# Patient Record
Sex: Male | Born: 1955
Health system: Southern US, Community
[De-identification: ages and names within clinical notes are randomized; demographics above are authoritative.]

## PROBLEM LIST (undated history)

## (undated) DIAGNOSIS — F17200 Nicotine dependence, unspecified, uncomplicated: Secondary | ICD-10-CM

## (undated) DIAGNOSIS — E669 Obesity, unspecified: Secondary | ICD-10-CM

## (undated) DIAGNOSIS — I10 Essential (primary) hypertension: Secondary | ICD-10-CM

## (undated) DIAGNOSIS — E785 Hyperlipidemia, unspecified: Secondary | ICD-10-CM

## (undated) DIAGNOSIS — E1169 Type 2 diabetes mellitus with other specified complication: Secondary | ICD-10-CM

## (undated) HISTORY — PX: ELBOW SURGERY: SHX618

## (undated) HISTORY — PX: BACK SURGERY: SHX140

---

## 2019-10-01 ENCOUNTER — Encounter (HOSPITAL_COMMUNITY): Payer: Self-pay | Admitting: Emergency Medicine

## 2019-10-01 ENCOUNTER — Inpatient Hospital Stay (HOSPITAL_COMMUNITY)
Admission: EM | Admit: 2019-10-01 | Discharge: 2019-10-04 | DRG: 065 | Disposition: A | Discharge status: Home Health | Payer: Medicare Other | Attending: Internal Medicine | Admitting: Internal Medicine

## 2019-10-01 ENCOUNTER — Inpatient Hospital Stay (HOSPITAL_COMMUNITY): Payer: Medicare Other

## 2019-10-01 ENCOUNTER — Other Ambulatory Visit: Payer: Self-pay

## 2019-10-01 ENCOUNTER — Emergency Department (HOSPITAL_COMMUNITY): Payer: Medicare Other

## 2019-10-01 DIAGNOSIS — I639 Cerebral infarction, unspecified: Secondary | ICD-10-CM | POA: Diagnosis present

## 2019-10-01 DIAGNOSIS — E7849 Other hyperlipidemia: Secondary | ICD-10-CM | POA: Diagnosis not present

## 2019-10-01 DIAGNOSIS — I6529 Occlusion and stenosis of unspecified carotid artery: Secondary | ICD-10-CM

## 2019-10-01 DIAGNOSIS — I10 Essential (primary) hypertension: Secondary | ICD-10-CM

## 2019-10-01 DIAGNOSIS — I6521 Occlusion and stenosis of right carotid artery: Secondary | ICD-10-CM

## 2019-10-01 DIAGNOSIS — I351 Nonrheumatic aortic (valve) insufficiency: Secondary | ICD-10-CM | POA: Diagnosis not present

## 2019-10-01 DIAGNOSIS — Z9114 Patient's other noncompliance with medication regimen: Secondary | ICD-10-CM

## 2019-10-01 DIAGNOSIS — I63411 Cerebral infarction due to embolism of right middle cerebral artery: Secondary | ICD-10-CM | POA: Diagnosis present

## 2019-10-01 DIAGNOSIS — R471 Dysarthria and anarthria: Secondary | ICD-10-CM | POA: Diagnosis present

## 2019-10-01 DIAGNOSIS — Z9119 Patient's noncompliance with other medical treatment and regimen: Secondary | ICD-10-CM | POA: Diagnosis not present

## 2019-10-01 DIAGNOSIS — R29810 Facial weakness: Secondary | ICD-10-CM | POA: Diagnosis present

## 2019-10-01 DIAGNOSIS — R4781 Slurred speech: Secondary | ICD-10-CM | POA: Diagnosis present

## 2019-10-01 DIAGNOSIS — E1165 Type 2 diabetes mellitus with hyperglycemia: Secondary | ICD-10-CM | POA: Diagnosis present

## 2019-10-01 DIAGNOSIS — R1310 Dysphagia, unspecified: Secondary | ICD-10-CM | POA: Diagnosis present

## 2019-10-01 DIAGNOSIS — Z598 Other problems related to housing and economic circumstances: Secondary | ICD-10-CM | POA: Diagnosis not present

## 2019-10-01 DIAGNOSIS — F17210 Nicotine dependence, cigarettes, uncomplicated: Secondary | ICD-10-CM | POA: Diagnosis present

## 2019-10-01 DIAGNOSIS — R29705 NIHSS score 5: Secondary | ICD-10-CM | POA: Diagnosis present

## 2019-10-01 DIAGNOSIS — E785 Hyperlipidemia, unspecified: Secondary | ICD-10-CM | POA: Diagnosis present

## 2019-10-01 DIAGNOSIS — I361 Nonrheumatic tricuspid (valve) insufficiency: Secondary | ICD-10-CM | POA: Diagnosis not present

## 2019-10-01 DIAGNOSIS — G8194 Hemiplegia, unspecified affecting left nondominant side: Secondary | ICD-10-CM | POA: Diagnosis present

## 2019-10-01 DIAGNOSIS — Z20822 Contact with and (suspected) exposure to covid-19: Secondary | ICD-10-CM | POA: Diagnosis present

## 2019-10-01 DIAGNOSIS — I63231 Cerebral infarction due to unspecified occlusion or stenosis of right carotid arteries: Secondary | ICD-10-CM | POA: Diagnosis not present

## 2019-10-01 DIAGNOSIS — Z91199 Patient's noncompliance with other medical treatment and regimen due to unspecified reason: Secondary | ICD-10-CM

## 2019-10-01 DIAGNOSIS — E119 Type 2 diabetes mellitus without complications: Secondary | ICD-10-CM | POA: Diagnosis not present

## 2019-10-01 HISTORY — DX: Essential (primary) hypertension: I10

## 2019-10-01 HISTORY — DX: Cerebral infarction, unspecified: I63.9

## 2019-10-01 LAB — COMPREHENSIVE METABOLIC PANEL
ALT: 13 U/L (ref 0–44)
AST: 14 U/L — ABNORMAL LOW (ref 15–41)
Albumin: 4.3 g/dL (ref 3.5–5.0)
Alkaline Phosphatase: 66 U/L (ref 38–126)
Anion gap: 9 (ref 5–15)
BUN: 12 mg/dL (ref 8–23)
CO2: 23 mmol/L (ref 22–32)
Calcium: 9.1 mg/dL (ref 8.9–10.3)
Chloride: 103 mmol/L (ref 98–111)
Creatinine, Ser: 0.76 mg/dL (ref 0.61–1.24)
GFR calc Af Amer: >60 mL/min (ref >60)
GFR calc non Af Amer: >60 mL/min (ref >60)
Glucose, Bld: 245 mg/dL — ABNORMAL HIGH (ref 70–99)
Potassium: 4 mmol/L (ref 3.5–5.1)
Sodium: 135 mmol/L (ref 135–145)
Total Bilirubin: 1.1 mg/dL (ref 0.3–1.2)
Total Protein: 7.5 g/dL (ref 6.5–8.1)

## 2019-10-01 LAB — DIFFERENTIAL
Abs Immature Granulocytes: 0.03 10*3/uL (ref 0.00–0.07)
Basophils Absolute: 0.1 10*3/uL (ref 0.0–0.1)
Basophils Relative: 1 %
Eosinophils Absolute: 0.2 10*3/uL (ref 0.0–0.5)
Eosinophils Relative: 1 %
Immature Granulocytes: 0 %
Lymphocytes Relative: 24 %
Lymphs Abs: 2.7 10*3/uL (ref 0.7–4.0)
Monocytes Absolute: 0.7 10*3/uL (ref 0.1–1.0)
Monocytes Relative: 6 %
Neutro Abs: 7.8 10*3/uL — ABNORMAL HIGH (ref 1.7–7.7)
Neutrophils Relative %: 68 %

## 2019-10-01 LAB — CBC
HCT: 48.6 % (ref 39.0–52.0)
Hemoglobin: 16.6 g/dL (ref 13.0–17.0)
MCH: 31.8 pg (ref 26.0–34.0)
MCHC: 34.2 g/dL (ref 30.0–36.0)
MCV: 93.1 fL (ref 80.0–100.0)
Platelets: 269 10*3/uL (ref 150–400)
RBC: 5.22 MIL/uL (ref 4.22–5.81)
RDW: 12.2 % (ref 11.5–15.5)
WBC: 11.4 10*3/uL — ABNORMAL HIGH (ref 4.0–10.5)
nRBC: 0 % (ref 0.0–0.2)

## 2019-10-01 LAB — RESPIRATORY PANEL BY RT PCR (FLU A&B, COVID)
Influenza A by PCR: NEGATIVE
Influenza B by PCR: NEGATIVE
SARS Coronavirus 2 by RT PCR: NEGATIVE

## 2019-10-01 LAB — PROTIME-INR
INR: 1 (ref 0.8–1.2)
Prothrombin Time: 12.9 seconds (ref 11.4–15.2)

## 2019-10-01 LAB — GLUCOSE, CAPILLARY: Glucose-Capillary: 117 mg/dL — ABNORMAL HIGH (ref 70–99)

## 2019-10-01 LAB — CBG MONITORING, ED: Glucose-Capillary: 114 mg/dL — ABNORMAL HIGH (ref 70–99)

## 2019-10-01 LAB — HEMOGLOBIN A1C
Hgb A1c MFr Bld: 7.7 % — ABNORMAL HIGH (ref 4.8–5.6)
Mean Plasma Glucose: 174.29 mg/dL

## 2019-10-01 LAB — I-STAT BETA HCG BLOOD, ED (MC, WL, AP ONLY): I-stat hCG, quantitative: <5 m[IU]/mL (ref <5)

## 2019-10-01 LAB — HIV ANTIBODY (ROUTINE TESTING W REFLEX): HIV Screen 4th Generation wRfx: NONREACTIVE

## 2019-10-01 LAB — APTT: aPTT: 29 seconds (ref 24–36)

## 2019-10-01 MED ORDER — ENOXAPARIN SODIUM 40 MG/0.4ML ~~LOC~~ SOLN
40.0000 mg | SUBCUTANEOUS | Status: DC
  Administered 2019-10-01: 40 mg via SUBCUTANEOUS
  Filled 2019-10-01: qty 0.4

## 2019-10-01 MED ORDER — STROKE: EARLY STAGES OF RECOVERY BOOK
Freq: Once | Status: AC
  Filled 2019-10-01: qty 1

## 2019-10-01 MED ORDER — IOHEXOL 350 MG/ML SOLN
75.0000 mL | Freq: Once | INTRAVENOUS | Status: AC | PRN
  Administered 2019-10-01: 75 mL via INTRAVENOUS

## 2019-10-01 MED ORDER — LORAZEPAM 2 MG/ML IJ SOLN
INTRAMUSCULAR | Status: AC
  Administered 2019-10-01: 17:00:00 0.5 mg via INTRAVENOUS
  Filled 2019-10-01: qty 1

## 2019-10-01 MED ORDER — ACETAMINOPHEN 325 MG PO TABS
650.0000 mg | ORAL_TABLET | ORAL | Status: DC | PRN
  Administered 2019-10-03 – 2019-10-04 (×2): 650 mg via ORAL
  Filled 2019-10-01 (×2): qty 2

## 2019-10-01 MED ORDER — LORAZEPAM 2 MG/ML IJ SOLN: 0.5000 mg | Freq: Once | INTRAMUSCULAR | Status: AC

## 2019-10-01 MED ORDER — HYDRALAZINE HCL 25 MG PO TABS: 25.0000 mg | ORAL_TABLET | Freq: Four times a day (QID) | ORAL | Status: DC | PRN

## 2019-10-01 MED ORDER — INSULIN ASPART 100 UNIT/ML ~~LOC~~ SOLN
0.0000 [IU] | Freq: Three times a day (TID) | SUBCUTANEOUS | Status: DC
  Administered 2019-10-02: 1 [IU] via SUBCUTANEOUS
  Administered 2019-10-02: 2 [IU] via SUBCUTANEOUS
  Administered 2019-10-02 – 2019-10-04 (×4): 1 [IU] via SUBCUTANEOUS

## 2019-10-01 MED ORDER — ASPIRIN EC 81 MG PO TBEC: 81.0000 mg | DELAYED_RELEASE_TABLET | Freq: Every day | ORAL | Status: DC

## 2019-10-01 MED ORDER — ASPIRIN 300 MG RE SUPP
300.0000 mg | Freq: Every day | RECTAL | Status: DC
  Administered 2019-10-01: 300 mg via RECTAL
  Filled 2019-10-01 (×2): qty 1

## 2019-10-01 MED ORDER — ATORVASTATIN CALCIUM 40 MG PO TABS
40.0000 mg | ORAL_TABLET | Freq: Every day | ORAL | Status: DC
  Administered 2019-10-02 – 2019-10-04 (×2): 40 mg via ORAL
  Filled 2019-10-01 (×2): qty 1

## 2019-10-01 MED ORDER — ACETAMINOPHEN 160 MG/5ML PO SOLN: 650.0000 mg | ORAL | Status: DC | PRN

## 2019-10-01 MED ORDER — ACETAMINOPHEN 650 MG RE SUPP: 650.0000 mg | RECTAL | Status: DC | PRN

## 2019-10-01 MED ORDER — SENNOSIDES-DOCUSATE SODIUM 8.6-50 MG PO TABS: 1.0000 | ORAL_TABLET | Freq: Every evening | ORAL | Status: DC | PRN

## 2019-10-01 MED ORDER — SODIUM CHLORIDE 0.9 % IV SOLN: INTRAVENOUS | Status: DC

## 2019-10-01 NOTE — ED Triage Notes (Signed)
Pt reports feeling weak all over and not being able to eat/drink due to it falling out of his mouth. Endorses backing into a car today but denies any pain. Symptoms have been going on for a week. States he just wants to sleep all the time. Not taking his BP meds.

## 2019-10-01 NOTE — Consult Note (Addendum)
Referring Physician: Dr. Anitra Lauth    Reason for Consult: can't drink or eat, drooling  HPI: David Hendrix is an 64 y.o. male with HTN and DM2, but non-compliant with medications d/t cost. Since Saturday, 4/24 the pt says he has not been feeling well and unable to eat/drink without chocking and noted drooling. Finally today when he went to the laundry mat, someone there noted a left facial droop as sign of stroke and he was brought to the ER for evaluation. Pt says it did not occur to him to look in the mirror since Sat.    Date last known well: sometime during the day on 09/28/19 tPA Given: no, way to far out of time window  Past Medical History Past Medical History:  Diagnosis Date  . Hypertension   DM2  Surgical History Lives alone, works as a Chartered certified accountant. Family History  Dad: died of ETOH liver disease Mom: Alzhimers, but otherwise alive and well Siblings: all alive and well w/o any medical issues reported  Social History:  No drugs or ETOH. Smokes cigaretts, 1PPD  Allergies:  No Known Allergies  Home Medications:  none  Hospital Medications   ROS:  History obtained from pt  General ROS: negative for - chills, fatigue, fever, night sweats, weight gain or weight loss Psychological ROS: negative for - behavioral disorder, hallucinations, memory difficulties, mood swings or suicidal ideation Ophthalmic ROS: negative for - blurry vision, double vision, eye pain or loss of vision ENT ROS: negative for - epistaxis, nasal discharge, oral lesions, sore throat, tinnitus or vertigo Allergy and Immunology ROS: negative for - hives or itchy/watery eyes Hematological and Lymphatic ROS: negative for - bleeding problems, bruising or swollen lymph nodes Endocrine ROS: negative for - galactorrhea, hair pattern changes, polydipsia/polyuria or temperature intolerance Respiratory ROS: negative for - cough, hemoptysis, shortness of breath or wheezing Cardiovascular ROS: negative for -  chest pain, dyspnea on exertion, edema or irregular heartbeat Gastrointestinal ROS: negative for - abdominal pain, diarrhea, hematemesis, nausea/vomiting or stool incontinence Genito-Urinary ROS: negative for - dysuria, hematuria, incontinence or urinary frequency/urgency Musculoskeletal ROS: negative for - joint swelling or muscular weakness Neurological ROS: as noted in HPI Dermatological ROS: negative for rash and skin lesion changes   Physical Examination:  Vitals:   10/01/19 1217 10/01/19 1430  BP: (!) 176/90   Pulse: 77 69  Resp: 18 13  Temp: 97.7 F (36.5 C)   TempSrc: Axillary   SpO2: 95% 98%  Weight: 95.3 kg   Height: 5\' 10"  (1.778 m)   HENT: Positive for drooling.   Respiratory: Negative for cough and shortness of breath.   Cardiovascular: Negative for chest pain.  Gastrointestinal: Negative for abdominal pain, anorexia and nausea.  Genitourinary: Negative for dysuria.  Musculoskeletal: Negative for falls.  Neurological: A&Ox4, speech is dysarthric. No anomia. No aphashia, PERRL, EOMI, dense left facial droop. Tongue is not midline.shrug is weak on left. 4/5 grip on left w/drift. 4/5 foot push w/ drift on lleft lrg. Rt wnl. FTN, HTS wnl Did not test gait  NIHSS 1a Level of Conscious.: 0 1b LOC Questions: 0 1c LOC Commands:0  2 Best Gaze: 0 3 Visual: 0 4 Facial Palsy: 2  5a Motor Arm - Left:1 5b Motor Arm - Right0:  6a Motor Leg - Left: 1 6b Motor Leg - Right:0  7 Limb Ataxia: 0 8 Sensory: 1 9 Best Language: 0 10 Dysarthria: 1 11 Extinct. and Inatten.: 1 TOTAL: 7  LABORATORY STUDIES:  Basic Metabolic Panel: Recent Labs  Lab 10/01/19 1227  NA 135  K 4.0  CL 103  CO2 23  GLUCOSE 245*  BUN 12  CREATININE 0.76  CALCIUM 9.1    Liver Function Tests: Recent Labs  Lab 10/01/19 1227  AST 14*  ALT 13  ALKPHOS 66  BILITOT 1.1  PROT 7.5  ALBUMIN 4.3   No results for input(s): LIPASE, AMYLASE in the last 168 hours. No results for input(s):  AMMONIA in the last 168 hours.  CBC: Recent Labs  Lab 10/01/19 1227  WBC 11.4*  NEUTROABS 7.8*  HGB 16.6  HCT 48.6  MCV 93.1  PLT 269    Cardiac Enzymes: No results for input(s): CKTOTAL, CKMB, CKMBINDEX, TROPONINI in the last 168 hours.  BNP: Invalid input(s): POCBNP  CBG: No results for input(s): GLUCAP in the last 168 hours.  Microbiology:   Coagulation Studies: Recent Labs    10/01/19 1227  LABPROT 12.9  INR 1.0    Urinalysis: No results for input(s): COLORURINE, LABSPEC, PHURINE, GLUCOSEU, HGBUR, BILIRUBINUR, KETONESUR, PROTEINUR, UROBILINOGEN, NITRITE, LEUKOCYTESUR in the last 168 hours.  Invalid input(s): APPERANCEUR  Lipid Panel:  No results found for: CHOL, TRIG, HDL, CHOLHDL, VLDL, LDLCALC  HgbA1C:  No results found for: HGBA1C  Urine Drug Screen:  No results found for: LABOPIA, COCAINSCRNUR, LABBENZ, AMPHETMU, THCU, LABBARB   Alcohol Level:  No results for input(s): ETH in the last 168 hours.  Miscellaneous labs:  EKG  EKG   IMAGING: CT HEAD WO CONTRAST  Result Date: 10/01/2019 CLINICAL DATA:  Weakness, possible stroke. EXAM: CT HEAD WITHOUT CONTRAST TECHNIQUE: Contiguous axial images were obtained from the base of the skull through the vertex without intravenous contrast. COMPARISON:  None. FINDINGS: Brain: Large right frontal low density is noted most consistent with acute infarction. No midline shift is noted. Ventricular size is within normal limits. No definite hemorrhage is noted. Vascular: No hyperdense vessel or unexpected calcification. Skull: Normal. Negative for fracture or focal lesion. Sinuses/Orbits: No acute finding. Other: None. IMPRESSION: Large right frontal low density is noted most consistent with acute infarction. No definite hemorrhage is noted. MRI is recommended for further evaluation. Electronically Signed   By: Marijo Conception M.D.   On: 10/01/2019 13:47   Assessment:  David Hendrix is a 64 y.o. male with history  of untreated HTN and DM2,  presenting with left side weakness, slurred speech, drooling and unable to eat/drink x 3 days. He did not receive IV t-PA due to out side of time window.  1. Subacute stroke- large right frontal hypodensity seen on CT, appears cardieoembolic etiology. MRI ordered for better etiology.  2. Lt side weakness, slurred speech, dysphagia- d/t above. Rehab evals pending.  Keep NPO till SLP can eval 3. Uncontrolled DM2 with hyperglycemia- gluc is 245, A1c pending 4. HTN, untreated- normotensive goals since this is not acute stroke 5. Tobacco smoker- counseled on quitting   Plan:  HgbA1c, fasting lipid panel  MRI brain without contrast  CT Angiogram Head and Neck   PT consult, OT consult, Speech consult  Echocardiogram  Prophylactic therapy - Antiplatelet medication, PR ASA since NPO  Normotensive BP goals  Statin Therapy - LDL goal < 70  Risk factor modification  Telemetry monitoring  Frequent neuro checks  Fall Precautions  DVT prophelaxis  NPO until swallowing evaluation has been passed    Attending Neurologist's note to follow  Desiree Metzger-Cihelka, ARNP-C, ANVP-BC Pager: [phone]   NEUROHOSPITALIST ADDENDUM Performed a face to face diagnostic evaluation.  I have reviewed the contents of history and physical exam as documented by PA/ARNP/Resident and agree with above documentation.  I have discussed and formulated the above plan as documented. Edits to the note have been made as needed.  64 year old male with past medical history of hypertension, diabetes presents with 3-day history of not being able to eat or drink and drooling.  MRI brain shows large right frontoparietal infarction secondary to M2 M3 occlusion.  CT angiogram confirms an ICA occlusion-likely acute.  Outside the window for thrombectomy/TPA.  Recommend close monitoring and if he worsens,  urgent consultation with Vascular or Neuro-IR.      Georgiana Spinner Ginger Leeth MD [facility] [phone]   If 7pm to 7am, please call on call as listed on AMION.

## 2019-10-01 NOTE — ED Provider Notes (Signed)
[facility] EMERGENCY DEPARTMENT Provider Note   CSN: [id number] Arrival date & time: 10/01/19  1206     History Chief Complaint  Patient presents with  . Weakness  . Facial Droop    David Hendrix is a 64 y.o. male.  The history is provided by the patient and a relative.  Weakness Severity:  Moderate Onset quality:  Gradual Duration:  3 days Timing:  Constant Progression:  Worsening Chronicity:  New Relieved by:  Nothing Worsened by:  Nothing Ineffective treatments:  None tried Associated symptoms: drooling, headaches and stroke symptoms   Associated symptoms: no abdominal pain, no anorexia, no chest pain, no cough, no difficulty walking, no dizziness, no dysuria, no falls, no loss of consciousness, no nausea, no sensory-motor deficit, no shortness of breath and no vision change   Risk factors comment:  Htn noncomplaint with meds and tobacco use      Past Medical History:  Diagnosis Date  . Hypertension     There are no problems to display for this patient.        No family history on file.  Social History   Tobacco Use  . Smoking status: Not on file  Substance Use Topics  . Alcohol use: Not on file  . Drug use: Not on file    Home Medications Prior to Admission medications   Not on File    Allergies    Patient has no known allergies.  Review of Systems   Review of Systems  HENT: Positive for drooling.   Respiratory: Negative for cough and shortness of breath.   Cardiovascular: Negative for chest pain.  Gastrointestinal: Negative for abdominal pain, anorexia and nausea.  Genitourinary: Negative for dysuria.  Musculoskeletal: Negative for falls.  Neurological: Positive for weakness and headaches. Negative for dizziness and loss of consciousness.  All other systems reviewed and are negative.   Physical Exam Updated Vital Signs BP (!) 176/90   Pulse 69   Temp 97.7 F (36.5 C) (Axillary)   Resp 13   Ht 5\' 10"  (1.778  m)   Wt 95.3 kg   SpO2 98%   BMI 30.13 kg/m   Physical Exam Vitals and nursing note reviewed.  Constitutional:      General: He is not in acute distress.    Appearance: Normal appearance. He is well-developed and normal weight.  HENT:     Head: Normocephalic and atraumatic.     Mouth/Throat:     Mouth: Mucous membranes are moist.  Eyes:     General: No visual field deficit.    Conjunctiva/sclera: Conjunctivae normal.     Pupils: Pupils are equal, round, and reactive to light.  Cardiovascular:     Rate and Rhythm: Normal rate and regular rhythm.     Pulses: Normal pulses.     Heart sounds: No murmur.  Pulmonary:     Effort: Pulmonary effort is normal. No respiratory distress.     Breath sounds: Normal breath sounds. No wheezing or rales.  Abdominal:     General: There is no distension.     Palpations: Abdomen is soft.     Tenderness: There is no abdominal tenderness. There is no guarding or rebound.  Musculoskeletal:        General: No tenderness. Normal range of motion.     Cervical back: Normal range of motion and neck supple.     Right lower leg: No edema.     Left lower leg: No edema.  Skin:  General: Skin is warm and dry.     Findings: No erythema or rash.  Neurological:     Mental Status: He is alert and oriented to person, place, and time.     Cranial Nerves: Facial asymmetry present. No dysarthria.     Sensory: Sensory deficit present.     Motor: No pronator drift.     Coordination: Heel to Shin Test normal.     Gait: Gait is intact.     Comments: Decreased sensation in left face.  Sparing of the left forehead.  5/5 strength in the bilateral upper and lower ext.  No pronator drift.    Psychiatric:        Behavior: Behavior normal.     Comments: Tearful on exam      ED Results / Procedures / Treatments   Labs (all labs ordered are listed, but only abnormal results are displayed) Labs Reviewed  CBC - Abnormal; Notable for the following components:       Result Value   WBC 11.4 (*)    All other components within normal limits  DIFFERENTIAL - Abnormal; Notable for the following components:   Neutro Abs 7.8 (*)    All other components within normal limits  COMPREHENSIVE METABOLIC PANEL - Abnormal; Notable for the following components:   Glucose, Bld 245 (*)    AST 14 (*)    All other components within normal limits  RESPIRATORY PANEL BY RT PCR (FLU A&B, COVID)  PROTIME-INR  APTT  I-STAT CHEM 8, ED  CBG MONITORING, ED  I-STAT BETA HCG BLOOD, ED ([facility], [facility], [facility] ONLY)    EKG EKG Interpretation  Date/Time:  Tuesday October 01 2019 12:16:32 EDT Ventricular Rate:  83 PR Interval:  152 QRS Duration: 82 QT Interval:  398 QTC Calculation: 467 R Axis:   40 Text Interpretation: Normal sinus rhythm Anterior infarct , age undetermined No previous tracing Confirmed by Gwyneth Sprout ([id number]) on 10/01/2019 2:21:15 PM   Radiology CT HEAD WO CONTRAST  Result Date: 10/01/2019 CLINICAL DATA:  Weakness, possible stroke. EXAM: CT HEAD WITHOUT CONTRAST TECHNIQUE: Contiguous axial images were obtained from the base of the skull through the vertex without intravenous contrast. COMPARISON:  None. FINDINGS: Brain: Large right frontal low density is noted most consistent with acute infarction. No midline shift is noted. Ventricular size is within normal limits. No definite hemorrhage is noted. Vascular: No hyperdense vessel or unexpected calcification. Skull: Normal. Negative for fracture or focal lesion. Sinuses/Orbits: No acute finding. Other: None. IMPRESSION: Large right frontal low density is noted most consistent with acute infarction. No definite hemorrhage is noted. MRI is recommended for further evaluation. Electronically Signed   By: Lupita Raider M.D.   On: 10/01/2019 13:47    Procedures Procedures (including critical care time)  Medications Ordered in ED Medications - No data to display  ED Course  I have reviewed the triage vital signs and  the nursing notes.  Pertinent labs & imaging results that were available during my care of the patient were reviewed by me and considered in my medical decision making (see chart for details).    MDM Rules/Calculators/A&P                      Patient presenting today with left-sided facial droop that has been present for at least a day now, unusual emotional behavior and some incoordination of his left arm.  Patient also reports he just feels very tired he wants to  sleep all the time and his been difficult to eat and drink because stuff is falling out of the left side of his mouth.  History of hypertension but does not take his medication regularly.  Also history of tobacco abuse.  On exam patient has left-sided facial droop but spares the forehead.  Concern for stroke and CT does show large area in the right frontal lobe concerning for possible stroke.  Patient does complain of also having some headaches.  He is hypertensive here but labs are otherwise within normal limits.  EKG without findings concerning for A. fib.  Spoke with Dr. Leia Alf with neurology recommends MR and MRA of brain.  Will admit for further stroke work-up.  MDM Number of Diagnoses or Management Options   Amount and/or Complexity of Data Reviewed Clinical lab tests: ordered and reviewed Tests in the radiology section of CPT: ordered and reviewed Tests in the medicine section of CPT: ordered and reviewed Decide to obtain previous medical records or to obtain history from someone other than the patient: yes Obtain history from someone other than the patient: yes Review and summarize past medical records: yes Discuss the patient with other providers: yes Independent visualization of images, tracings, or specimens: yes  Risk of Complications, Morbidity, and/or Mortality Presenting problems: moderate Diagnostic procedures: low Management options: low  Patient Progress Patient progress: stable  Final Clinical  Impression(s) / ED Diagnoses Final diagnoses:  Cerebrovascular accident (CVA), unspecified mechanism ([place])    Rx / DC Orders ED Discharge Orders    None       Blanchie Dessert, MD 10/01/19 1547

## 2019-10-01 NOTE — H&P (Signed)
History and Physical    [name] CWC:[id number] DOB: 03-Apr-1956 DOA: 10/01/2019  PCP: Patient, No Pcp Per   Patient coming from: Home  I have personally briefly reviewed patient's old medical records in [facility]  Chief Complaint: Drooling and trouble swallowing  HPI: David Hendrix is a 64 y.o. male with medical history significant of HTN and IIDM, but non-compliant with medications due to insurance issue.  Patient started to feel sick 3 days ago, with some trouble swallowing food and water think the food and water just slipped off the corner of his lips, but no significant coughing or choking no significant weakness at that point.  Today, while in the laundromat patient was told by a bystander that he has left facial droop as sign of stroke and he was brought to the ER for evaluation.  Patient denied any weakness of any of the limbs were numb or tingling of any of the limbs, no blurred vision or hearing changes no headache. ED Course: CT shows right frontal acute stroke  Review of Systems: As per HPI otherwise 10 point review of systems negative.    Past Medical History:  Diagnosis Date  . Hypertension        has no history on file for tobacco, alcohol, and drug.  No Known Allergies  No family history on file.   Prior to Admission medications   Not on File    Physical Exam: Vitals:   10/01/19 1217 10/01/19 1430 10/01/19 1603  BP: (!) 176/90  (!) 158/71  Pulse: 77 69 67  Resp: 18 13 17   Temp: 97.7 F (36.5 C)    TempSrc: Axillary    SpO2: 95% 98% 98%  Weight: 95.3 kg    Height: 5\' 10"  (1.778 m)      Constitutional: NAD, calm, comfortable Vitals:   10/01/19 1217 10/01/19 1430 10/01/19 1603  BP: (!) 176/90  (!) 158/71  Pulse: 77 69 67  Resp: 18 13 17   Temp: 97.7 F (36.5 C)    TempSrc: Axillary    SpO2: 95% 98% 98%  Weight: 95.3 kg    Height: 5\' 10"  (1.778 m)     Eyes: PERRL, lids and conjunctivae normal ENMT: Mucous membranes are moist.  Posterior pharynx clear of any exudate or lesions.Normal dentition.  Neck: normal, supple, no masses, no thyromegaly Respiratory: clear to auscultation bilaterally, no wheezing, no crackles. Normal respiratory effort. No accessory muscle use.  Cardiovascular: Regular rate and rhythm, no murmurs / rubs / gallops. No extremity edema. 2+ pedal pulses. No carotid bruits.  Abdomen: no tenderness, no masses palpated. No hepatosplenomegaly. Bowel sounds positive.  Musculoskeletal: no clubbing / cyanosis. No joint deformity upper and lower extremities. Good ROM, no contractures. Normal muscle tone.  Skin: no rashes, lesions, ulcers. No induration Neurologic: Left facial drooping, muscle strength 5/5 all major muscle groups, light touch sensation grossly intact, coordination intact.  Visual field intact Psychiatric: Normal judgment and insight. Alert and oriented x 3. Normal mood.     Labs on Admission: I have personally reviewed following labs and imaging studies  CBC: Recent Labs  Lab 10/01/19 1227  WBC 11.4*  NEUTROABS 7.8*  HGB 16.6  HCT 48.6  MCV 93.1  PLT 761   Basic Metabolic Panel: Recent Labs  Lab 10/01/19 1227  NA 135  K 4.0  CL 103  CO2 23  GLUCOSE 245*  BUN 12  CREATININE 0.76  CALCIUM 9.1   GFR: Estimated Creatinine Clearance: 106.6 mL/min (by C-G formula  based on SCr of 0.76 mg/dL). Liver Function Tests: Recent Labs  Lab 10/01/19 1227  AST 14*  ALT 13  ALKPHOS 66  BILITOT 1.1  PROT 7.5  ALBUMIN 4.3   No results for input(s): LIPASE, AMYLASE in the last 168 hours. No results for input(s): AMMONIA in the last 168 hours. Coagulation Profile: Recent Labs  Lab 10/01/19 1227  INR 1.0   Cardiac Enzymes: No results for input(s): CKTOTAL, CKMB, CKMBINDEX, TROPONINI in the last 168 hours. BNP (last 3 results) No results for input(s): PROBNP in the last 8760 hours. HbA1C: No results for input(s): HGBA1C in the last 72 hours. CBG: No results for input(s):  GLUCAP in the last 168 hours. Lipid Profile: No results for input(s): CHOL, HDL, LDLCALC, TRIG, CHOLHDL, LDLDIRECT in the last 72 hours. Thyroid Function Tests: No results for input(s): TSH, T4TOTAL, FREET4, T3FREE, THYROIDAB in the last 72 hours. Anemia Panel: No results for input(s): VITAMINB12, FOLATE, FERRITIN, TIBC, IRON, RETICCTPCT in the last 72 hours. Urine analysis: No results found for: COLORURINE, APPEARANCEUR, LABSPEC, PHURINE, GLUCOSEU, HGBUR, BILIRUBINUR, KETONESUR, PROTEINUR, UROBILINOGEN, NITRITE, LEUKOCYTESUR  Radiological Exams on Admission: DG Chest 2 View  Result Date: 10/01/2019 CLINICAL DATA:  Hypertension CVA EXAM: CHEST - 2 VIEW COMPARISON:  None. FINDINGS: No focal airspace disease or pleural effusion. Borderline cardiomegaly. Hazy/streaky atelectasis or scarring at the bases. No pneumothorax. IMPRESSION: Hazy/streaky atelectasis or scarring at the bases. Electronically Signed   By: Jasmine Pang M.D.   On: 10/01/2019 17:01   CT HEAD WO CONTRAST  Result Date: 10/01/2019 CLINICAL DATA:  Weakness, possible stroke. EXAM: CT HEAD WITHOUT CONTRAST TECHNIQUE: Contiguous axial images were obtained from the base of the skull through the vertex without intravenous contrast. COMPARISON:  None. FINDINGS: Brain: Large right frontal low density is noted most consistent with acute infarction. No midline shift is noted. Ventricular size is within normal limits. No definite hemorrhage is noted. Vascular: No hyperdense vessel or unexpected calcification. Skull: Normal. Negative for fracture or focal lesion. Sinuses/Orbits: No acute finding. Other: None. IMPRESSION: Large right frontal low density is noted most consistent with acute infarction. No definite hemorrhage is noted. MRI is recommended for further evaluation. Electronically Signed   By: Lupita Raider M.D.   On: 10/01/2019 13:47    EKG: Independently reviewed.  Normal sinus rhythm, no acute ST-T  changes  Assessment/Plan Active Problems:   CVA (cerebral vascular accident) (HCC)  Acute CVA, right frontal N.p.o. for now, speech evaluation CT angiogram Allow permissive hypertension for 2 to 3 days Start aspirin and statin Check A1c and lipid panel Echo, Tele monitoring  HTN As needed Hydralazine  IIDM Check A1C Sliding scale for now   DVT prophylaxis: Lovenox Code Status: Full code Family Communication: Daughter at bedside Disposition Plan: Follow-up speech evaluation, likely will need more than 2 midnight hospital stay Consults called: Neurology Admission status: Telemetry admission   Emeline General MD [facility] Pager [phone]    10/01/2019, 5:41 PM

## 2019-10-01 NOTE — Progress Notes (Signed)
D/W on call neurology Dr. Amada Jupiter, who reviewed CTA with neuro-IR Dr. Loreta Ave, given pt's symptoms started more than 3 days ago, unlikely will benefit from any emergency re-vascularization. Probably will consider angiogram some time during this admission, other management as planned earlier.

## 2019-10-02 ENCOUNTER — Inpatient Hospital Stay (HOSPITAL_COMMUNITY): Payer: Medicare Other

## 2019-10-02 DIAGNOSIS — E119 Type 2 diabetes mellitus without complications: Secondary | ICD-10-CM

## 2019-10-02 DIAGNOSIS — I6521 Occlusion and stenosis of right carotid artery: Secondary | ICD-10-CM

## 2019-10-02 DIAGNOSIS — I361 Nonrheumatic tricuspid (valve) insufficiency: Secondary | ICD-10-CM

## 2019-10-02 DIAGNOSIS — I639 Cerebral infarction, unspecified: Secondary | ICD-10-CM

## 2019-10-02 DIAGNOSIS — I351 Nonrheumatic aortic (valve) insufficiency: Secondary | ICD-10-CM

## 2019-10-02 DIAGNOSIS — E785 Hyperlipidemia, unspecified: Secondary | ICD-10-CM

## 2019-10-02 LAB — HEMOGLOBIN A1C
Hgb A1c MFr Bld: 7.6 % — ABNORMAL HIGH (ref 4.8–5.6)
Mean Plasma Glucose: 171.42 mg/dL

## 2019-10-02 LAB — ECHOCARDIOGRAM COMPLETE
Height: 70 in
Weight: 3360 oz

## 2019-10-02 LAB — LIPID PANEL
Cholesterol: 199 mg/dL (ref 0–200)
HDL: 22 mg/dL — ABNORMAL LOW (ref >40)
LDL Cholesterol: 132 mg/dL — ABNORMAL HIGH (ref 0–99)
Total CHOL/HDL Ratio: 9 RATIO
Triglycerides: 226 mg/dL — ABNORMAL HIGH (ref <150)
VLDL: 45 mg/dL — ABNORMAL HIGH (ref 0–40)

## 2019-10-02 LAB — GLUCOSE, CAPILLARY
Glucose-Capillary: 145 mg/dL — ABNORMAL HIGH (ref 70–99)
Glucose-Capillary: 139 mg/dL — ABNORMAL HIGH (ref 70–99)
Glucose-Capillary: 149 mg/dL — ABNORMAL HIGH (ref 70–99)
Glucose-Capillary: 157 mg/dL — ABNORMAL HIGH (ref 70–99)
Glucose-Capillary: 149 mg/dL — ABNORMAL HIGH (ref 70–99)
Glucose-Capillary: 187 mg/dL — ABNORMAL HIGH (ref 70–99)

## 2019-10-02 MED ORDER — ENOXAPARIN SODIUM 40 MG/0.4ML ~~LOC~~ SOLN: 40.0000 mg | SUBCUTANEOUS | Status: DC

## 2019-10-02 MED ORDER — ASPIRIN 81 MG PO CHEW
81.0000 mg | CHEWABLE_TABLET | Freq: Every day | ORAL | Status: DC
  Administered 2019-10-02: 81 mg via ORAL
  Filled 2019-10-02: qty 1

## 2019-10-02 MED ORDER — CLOPIDOGREL BISULFATE 75 MG PO TABS
75.0000 mg | ORAL_TABLET | Freq: Every day | ORAL | Status: DC
  Administered 2019-10-02: 75 mg via ORAL
  Filled 2019-10-02: qty 1

## 2019-10-02 NOTE — Progress Notes (Signed)
SLP Cancellation Note  Patient Details Name: Deshaun Weisinger MRN: [id number] DOB: 02/23/1956   Cancelled treatment:       Reason Eval/Treat Not Completed: Patient at procedure or test/unavailable. Attempted cognitive linguistic eval, pt engaged with other providers. Will f/u.   Harlon Ditty, MA CCC-SLP  Acute Rehabilitation Services Pager [phone] Office [phone]  Claudine Mouton 10/02/2019, 9:56 AM

## 2019-10-02 NOTE — Social Work (Signed)
CSW met with pt at bedside. CSW introduced self and explained her role. CSW completed sbirt with pt.  Pt scored a 0 on the sbirt scale. Pt denied alcohol use. Pt denied substance use. Pt did not need resources at this time.  Emeterio Reeve, Latanya Presser, [place] Social Worker [phone]

## 2019-10-02 NOTE — Progress Notes (Addendum)
Rehab Admissions Coordinator Note:  Patient was screened by Clois Dupes for appropriateness for an Inpatient Acute Rehab Consult per PT and OT recs.   At this time, we are recommending Inpatient Rehab consult. I will place order per protocol.   Clois Dupes RN MSN 10/02/2019, 2:54 PM  I can be reached at [phone].

## 2019-10-02 NOTE — Consult Note (Signed)
Chief Complaint: Patient was seen in consultation today for right ICA occlusion/diagnostic cerebral arteriogram.  Referring Physician(s): David Hendrix  Supervising Physician: David Hendrix  Patient Status: David Hendrix - In-pt  History of Present Illness: David Hendrix is a 64 y.o. male with a past medical history of hypertension, diabetes mellitus type II, and tobacco use. He presented to [facility] ED 10/01/2019 with complaints of headache, facial droop, and progressive left-sided weakness. In ED, CT head revealed large right frontal hypodensity. She was admitted for further management. Further imaging studies were obtained which revealed a right ICA occlusion, possible cause of infarct. Neurology was consulted who recommended NIR consultation for possible diagnostic cerebral arteriogram to evaluate right ICA occlusion.  CT head 10/01/2019: 1. Large right frontal low density is noted most consistent with acute infarction. No definite hemorrhage is noted. MRI is recommended for further evaluation.  MR brain 10/01/2019: 1. 6.6 x 6.1 x 4.0 cm region of acute infarction affecting the right insula and right frontal operculum consistent with right MCA branch vessel infarction. Slow flow or no flow in the right ICA at the skull base. The infarction shows mild swelling but there is no mass effect or shift. No sign of blood products within the infarction. 2. Punctate acute infarction also demonstrated in the medial right hypothalamus. 3. Background pattern of moderate chronic small-vessel ischemic changes affecting the cerebral hemispheric white matter. 4. Old right occipital cortical and subcortical infarction.  CTA head/neck 10/01/2019: 1. Right internal carotid artery occlusion in the ICA bulb. 2. Flow to the anterior circulation on the right occurs by patent anterior and posterior communicating arteries. I do not see significant external to internal collateral flow. 3. Apparent missing M2 to M3  branch serving the right frontal operculum infarction region.  NIR consulted by David Hendrix for possible image-guided diagnostic cerebral arteriogram. Patient awake and alert sitting in chair. Accompanied by daughter at bedside. Complains of right-sided headache, rated 8/10 at this time. States "it feels like my ear hurts". Denies N/V, dizziness, or vision changes associated with headache. Denies fever, chills, chest pain, dyspnea, or abdominal pain.   Past Medical History:  Diagnosis Date  . Hypertension     Allergies: Patient has no known allergies.  Medications: Prior to Admission medications   Not on File     No family history on file.  Social History   Socioeconomic History  . Marital status: Single    Spouse name: Not on file  . Number of children: Not on file  . Years of education: Not on file  . Highest education level: Not on file  Occupational History  . Not on file  Tobacco Use  . Smoking status: Not on file  Substance and Sexual Activity  . Alcohol use: Not on file  . Drug use: Not on file  . Sexual activity: Not on file  Other Topics Concern  . Not on file  Social History Narrative  . Not on file   Social Determinants of Health   Financial Resource Strain:   . Difficulty of Paying Living Expenses:   Food Insecurity:   . Worried About Programme researcher, broadcasting/film/video in the Last Year:   . Barista in the Last Year:   Transportation Needs:   . Freight forwarder (Medical):   Marland Kitchen Lack of Transportation (Non-Medical):   Physical Activity:   . Days of Exercise per Week:   . Minutes of Exercise per Session:   Stress:   . Feeling  of Stress :   Social Connections:   . Frequency of Communication with Friends and Family:   . Frequency of Social Gatherings with Friends and Family:   . Attends Religious Services:   . Active Member of Clubs or Organizations:   . Attends Archivist Meetings:   Marland Kitchen Marital Status:      Review of Systems: A 12  point ROS discussed and pertinent positives are indicated in the HPI above.  All other systems are negative.  Review of Systems  Constitutional: Negative for chills and fever.  Eyes: Negative for visual disturbance.  Respiratory: Negative for shortness of breath and wheezing.   Cardiovascular: Negative for chest pain and palpitations.  Gastrointestinal: Negative for abdominal pain, nausea and vomiting.  Neurological: Positive for headaches. Negative for dizziness.  Psychiatric/Behavioral: Negative for behavioral problems and confusion.    Vital Signs: BP (!) 148/75 (BP Location: Left Arm)   Pulse 72   Temp 99 F (37.2 C) (Oral)   Resp 18   Ht 5\' 10"  (1.778 m)   Wt 210 lb (95.3 kg)   SpO2 98%   BMI 30.13 kg/m   Physical Exam Vitals and nursing note reviewed.  Constitutional:      General: He is not in acute distress.    Appearance: Normal appearance.  Cardiovascular:     Rate and Rhythm: Normal rate and regular rhythm.     Heart sounds: Normal heart sounds. No murmur.  Pulmonary:     Effort: Pulmonary effort is normal. No respiratory distress.     Breath sounds: Normal breath sounds. No wheezing.  Skin:    General: Skin is warm and dry.  Neurological:     Mental Status: He is alert and oriented to person, place, and time.     Comments: Left facial droop.      MD Evaluation Airway: WNL Heart: WNL Abdomen: WNL Chest/ Lungs: WNL ASA  Classification: 3 Mallampati/Airway Score: Two   Imaging: CT ANGIO HEAD W OR WO CONTRAST  Addendum Date: 10/01/2019   ADDENDUM REPORT: 10/01/2019 18:55 ADDENDUM: Aortic Atherosclerosis (ICD10-I70.0) and Emphysema (ICD10-J43.9). 5 mm pulmonary nodule right upper lobe. Non-contrast chest CT can be considered in 12 months if patient is high-risk, which appears to be the case based on the emphysema. This recommendation follows the consensus statement: Guidelines for Management of Incidental Pulmonary Nodules Detected on CT Images: From the  Fleischner Society 2017; Radiology 2017; 284:228-243. Electronically Signed   By: David Hendrix M.D.   On: 10/01/2019 18:55   Result Date: 10/01/2019 CLINICAL DATA:  Weakness and facial droop. Difficulty swallowing. Acute infarction right frontal lobe. Abnormal flow right internal carotid artery. EXAM: CT ANGIOGRAPHY HEAD AND NECK TECHNIQUE: Multidetector CT imaging of the head and neck was performed using the standard protocol during bolus administration of intravenous contrast. Multiplanar CT image reconstructions and MIPs were obtained to evaluate the vascular anatomy. Carotid stenosis measurements (when applicable) are obtained utilizing NASCET criteria, using the distal internal carotid diameter as the denominator. CONTRAST:  66mL OMNIPAQUE IOHEXOL 350 MG/ML SOLN COMPARISON:  CT and MRI same day FINDINGS: Aortic atherosclerosis. Branching pattern is normal without origin stenosis. CTA NECK FINDINGS Aortic arch: Aortic atherosclerosis. Branching pattern is normal without origin stenosis. Right carotid system: Common carotid artery widely patent to the bifurcation. Occlusion of the ICA in the bulb. No reconstituted flow seen in the cervical region. Left carotid system: Common carotid artery widely patent to the bifurcation. Calcified and soft plaque at the carotid bifurcation  and ICA bulb but no stenosis compared to the more distal cervical ICA diameter. Vertebral arteries: Both vertebral artery origins are widely patent. The vertebral arteries are patent through the cervical region to the foramen magnum without stenosis greater than 30%. Skeleton: Ordinary cervical spondylosis and facet arthropathy. Other neck: No mass or lymphadenopathy. Upper chest: Emphysema and pulmonary scarring. 5 mm pulmonary nodule in the right upper lobe. Review of the MIP images confirms the above findings CTA HEAD FINDINGS Anterior circulation: Right internal carotid artery does not show any flow through the skull base. Reconstituted  in flow at the carotid terminus primarily on the basis of a patent anterior communicating and posterior communicating arteries. Left internal carotid artery is patent through the siphon region without stenosis greater than 30%. No large or medium vessel occlusion seen on the left. On the right, there is a missing M2 to M3 branch serving the frontal operculum. Posterior circulation: Both vertebral arteries widely patent through the foramen magnum. Right vertebral terminates in PICA. Left vertebral artery supplies the basilar. No basilar stenosis. Posterior circulation branch vessels are patent. Distal branch vessels do show atherosclerotic irregularity. Venous sinuses: Patent and normal. Anatomic variants: None significant. Review of the MIP images confirms the above findings IMPRESSION: Right internal carotid artery occlusion in the ICA bulb. Flow to the anterior circulation on the right occurs by patent anterior and posterior communicating arteries. I do not see significant external to internal collateral flow. Apparent missing M2 to M3 branch serving the right frontal operculum infarction region. Electronically Signed: By: Paulina Fusi M.D. On: 10/01/2019 18:46   DG Chest 2 View  Result Date: 10/01/2019 CLINICAL DATA:  Hypertension CVA EXAM: CHEST - 2 VIEW COMPARISON:  None. FINDINGS: No focal airspace disease or pleural effusion. Borderline cardiomegaly. Hazy/streaky atelectasis or scarring at the bases. No pneumothorax. IMPRESSION: Hazy/streaky atelectasis or scarring at the bases. Electronically Signed   By: Jasmine Pang M.D.   On: 10/01/2019 17:01   CT HEAD WO CONTRAST  Result Date: 10/01/2019 CLINICAL DATA:  Weakness, possible stroke. EXAM: CT HEAD WITHOUT CONTRAST TECHNIQUE: Contiguous axial images were obtained from the base of the skull through the vertex without intravenous contrast. COMPARISON:  None. FINDINGS: Brain: Large right frontal low density is noted most consistent with acute  infarction. No midline shift is noted. Ventricular size is within normal limits. No definite hemorrhage is noted. Vascular: No hyperdense vessel or unexpected calcification. Skull: Normal. Negative for fracture or focal lesion. Sinuses/Orbits: No acute finding. Other: None. IMPRESSION: Large right frontal low density is noted most consistent with acute infarction. No definite hemorrhage is noted. MRI is recommended for further evaluation. Electronically Signed   By: Lupita Raider M.D.   On: 10/01/2019 13:47   CT ANGIO NECK W OR WO CONTRAST  Addendum Date: 10/01/2019   ADDENDUM REPORT: 10/01/2019 18:55 ADDENDUM: Aortic Atherosclerosis (ICD10-I70.0) and Emphysema (ICD10-J43.9). 5 mm pulmonary nodule right upper lobe. Non-contrast chest CT can be considered in 12 months if patient is high-risk, which appears to be the case based on the emphysema. This recommendation follows the consensus statement: Guidelines for Management of Incidental Pulmonary Nodules Detected on CT Images: From the Fleischner Society 2017; Radiology 2017; 284:228-243. Electronically Signed   By: Paulina Fusi M.D.   On: 10/01/2019 18:55   Result Date: 10/01/2019 CLINICAL DATA:  Weakness and facial droop. Difficulty swallowing. Acute infarction right frontal lobe. Abnormal flow right internal carotid artery. EXAM: CT ANGIOGRAPHY HEAD AND NECK TECHNIQUE: Multidetector CT imaging of  the head and neck was performed using the standard protocol during bolus administration of intravenous contrast. Multiplanar CT image reconstructions and MIPs were obtained to evaluate the vascular anatomy. Carotid stenosis measurements (when applicable) are obtained utilizing NASCET criteria, using the distal internal carotid diameter as the denominator. CONTRAST:  75mL OMNIPAQUE IOHEXOL 350 MG/ML SOLN COMPARISON:  CT and MRI same day FINDINGS: Aortic atherosclerosis. Branching pattern is normal without origin stenosis. CTA NECK FINDINGS Aortic arch: Aortic  atherosclerosis. Branching pattern is normal without origin stenosis. Right carotid system: Common carotid artery widely patent to the bifurcation. Occlusion of the ICA in the bulb. No reconstituted flow seen in the cervical region. Left carotid system: Common carotid artery widely patent to the bifurcation. Calcified and soft plaque at the carotid bifurcation and ICA bulb but no stenosis compared to the more distal cervical ICA diameter. Vertebral arteries: Both vertebral artery origins are widely patent. The vertebral arteries are patent through the cervical region to the foramen magnum without stenosis greater than 30%. Skeleton: Ordinary cervical spondylosis and facet arthropathy. Other neck: No mass or lymphadenopathy. Upper chest: Emphysema and pulmonary scarring. 5 mm pulmonary nodule in the right upper lobe. Review of the MIP images confirms the above findings CTA HEAD FINDINGS Anterior circulation: Right internal carotid artery does not show any flow through the skull base. Reconstituted in flow at the carotid terminus primarily on the basis of a patent anterior communicating and posterior communicating arteries. Left internal carotid artery is patent through the siphon region without stenosis greater than 30%. No large or medium vessel occlusion seen on the left. On the right, there is a missing M2 to M3 branch serving the frontal operculum. Posterior circulation: Both vertebral arteries widely patent through the foramen magnum. Right vertebral terminates in PICA. Left vertebral artery supplies the basilar. No basilar stenosis. Posterior circulation branch vessels are patent. Distal branch vessels do show atherosclerotic irregularity. Venous sinuses: Patent and normal. Anatomic variants: None significant. Review of the MIP images confirms the above findings IMPRESSION: Right internal carotid artery occlusion in the ICA bulb. Flow to the anterior circulation on the right occurs by patent anterior and  posterior communicating arteries. I do not see significant external to internal collateral flow. Apparent missing M2 to M3 branch serving the right frontal operculum infarction region. Electronically Signed: By: Paulina FusiMark  Shogry M.D. On: 10/01/2019 18:46   MR BRAIN WO CONTRAST  Result Date: 10/01/2019 CLINICAL DATA:  Hypertension and diabetes. Difficulty swallowing. Weakness. Abnormal head CT right frontal region. EXAM: MRI HEAD WITHOUT CONTRAST TECHNIQUE: Multiplanar, multiecho pulse sequences of the brain and surrounding structures were obtained without intravenous contrast. COMPARISON:  Head CT same day FINDINGS: Brain: There is acute infarction in the right insula and frontal operculum with the region measuring approximately 6.6 x 6.1 x 4.0 cm. Swelling of the region of infarction but without hemorrhage. Punctate acute infarction in the medial right hypothalamus. No focal abnormality affects the brainstem or cerebellum. Cerebral hemispheres otherwise show moderate chronic small-vessel changes of the deep and subcortical white matter. There is an old cortical and subcortical infarction in the right occipital lobe. No midline shift. No hydrocephalus. No extra-axial collection. Vascular: Abnormal flow pattern in the right internal carotid artery. There could be right internal carotid artery occlusion or very slow flow. Skull and upper cervical spine: Negative Sinuses/Orbits: Mild seasonal mucosal inflammatory changes of the sinuses. Orbits negative. Other: None IMPRESSION: 6.6 x 6.1 x 4.0 cm region of acute infarction affecting the right insula and right  frontal operculum consistent with right MCA branch vessel infarction. Slow flow or no flow in the right ICA at the skull base. The infarction shows mild swelling but there is no mass effect or shift. No sign of blood products within the infarction. Punctate acute infarction also demonstrated in the medial right hypothalamus. Background pattern of moderate chronic  small-vessel ischemic changes affecting the cerebral hemispheric white matter. Old right occipital cortical and subcortical infarction. Electronically Signed   By: Paulina Fusi M.D.   On: 10/01/2019 17:42   ECHOCARDIOGRAM COMPLETE  Result Date: 10/02/2019    ECHOCARDIOGRAM REPORT   Patient Name:   GRIFFIN GERRARD Date of Exam: 10/02/2019 Medical Rec #:  [id number]       Height:       70.0 in Accession #:    [id number]      Weight:       210.0 lb Date of Birth:  [birth year]       BSA:          2.131 m Patient Age:    63 years        BP:           136/54 mmHg Patient Gender: M               HR:           65 bpm. Exam Location:  Inpatient Procedure: 2D Echo Indications:    TIA 435.9 / G45.9  History:        Patient has no prior history of Echocardiogram examinations. No                 prior cardiac history.  Sonographer:    Leeroy Bock Turrentine Referring Phys: [id number] [place] General  Sonographer Comments: Poor patient compliance. IMPRESSIONS  1. Left ventricular ejection fraction, by estimation, is 60 to 65%. The left ventricle has normal function. The left ventricle has no regional wall motion abnormalities. There is mild concentric left ventricular hypertrophy. Left ventricular diastolic parameters are consistent with Grade I diastolic dysfunction (impaired relaxation).  2. Right ventricular systolic function is normal. The right ventricular size is normal. There is normal pulmonary artery systolic pressure.  3. The mitral valve is normal in structure. Trivial mitral valve regurgitation. No evidence of mitral stenosis.  4. The aortic valve is normal in structure. Aortic valve regurgitation is mild. No aortic stenosis is present.  5. The inferior vena cava is normal in size with greater than 50% respiratory variability, suggesting right atrial pressure of 3 mmHg. FINDINGS  Left Ventricle: Left ventricular ejection fraction, by estimation, is 60 to 65%. The left ventricle has normal function. The left ventricle has no  regional wall motion abnormalities. The left ventricular internal cavity size was normal in size. There is  mild concentric left ventricular hypertrophy. Left ventricular diastolic parameters are consistent with Grade I diastolic dysfunction (impaired relaxation). Normal left ventricular filling pressure. Right Ventricle: The right ventricular size is normal. No increase in right ventricular wall thickness. Right ventricular systolic function is normal. There is normal pulmonary artery systolic pressure. The tricuspid regurgitant velocity is 2.09 m/s, and  with an assumed right atrial pressure of 8 mmHg, the estimated right ventricular systolic pressure is 25.5 mmHg. Left Atrium: Left atrial size was normal in size. Right Atrium: Right atrial size was normal in size. Pericardium: There is no evidence of pericardial effusion. Mitral Valve: The mitral valve is normal in structure. Normal mobility of the mitral valve leaflets. Trivial mitral valve  regurgitation. No evidence of mitral valve stenosis. Tricuspid Valve: The tricuspid valve is normal in structure. Tricuspid valve regurgitation is mild . No evidence of tricuspid stenosis. Aortic Valve: The aortic valve is normal in structure. Aortic valve regurgitation is mild. No aortic stenosis is present. Aortic valve mean gradient measures 5.0 mmHg. Aortic valve peak gradient measures 7.7 mmHg. Aortic valve area, by VTI measures 2.37 cm. Pulmonic Valve: The pulmonic valve was normal in structure. Pulmonic valve regurgitation is trivial. No evidence of pulmonic stenosis. Aorta: The aortic root is normal in size and structure. Venous: The inferior vena cava is normal in size with greater than 50% respiratory variability, suggesting right atrial pressure of 3 mmHg. IAS/Shunts: No atrial level shunt detected by color flow Doppler.  LEFT VENTRICLE PLAX 2D LVIDd:         4.60 cm  Diastology LVIDs:         3.09 cm  LV e' lateral:   9.68 cm/s LV PW:         1.11 cm  LV E/e'  lateral: 7.4 LV IVS:        1.11 cm  LV e' medial:    7.72 cm/s LVOT diam:     2.00 cm  LV E/e' medial:  9.3 LV SV:         78 LV SV Index:   36 LVOT Area:     3.14 cm  RIGHT VENTRICLE RV S prime:     11.30 cm/s TAPSE (M-mode): 2.1 cm LEFT ATRIUM             Index       RIGHT ATRIUM           Index LA diam:        3.40 cm 1.60 cm/m  RA Area:     16.30 cm LA Vol (A2C):   36.4 ml 17.08 ml/m RA Volume:   43.70 ml  20.51 ml/m LA Vol (A4C):   40.3 ml 18.91 ml/m LA Biplane Vol: 41.1 ml 19.29 ml/m  AORTIC VALVE AV Area (Vmax):    2.62 cm AV Area (Vmean):   2.37 cm AV Area (VTI):     2.37 cm AV Vmax:           139.00 cm/s AV Vmean:          105.000 cm/s AV VTI:            0.327 m AV Peak Grad:      7.7 mmHg AV Mean Grad:      5.0 mmHg LVOT Vmax:         116.00 cm/s LVOT Vmean:        79.100 cm/s LVOT VTI:          0.247 m LVOT/AV VTI ratio: 0.76  AORTA Ao Root diam: 3.50 cm MITRAL VALVE                TRICUSPID VALVE MV Area (PHT): 2.76 cm     TR Peak grad:   17.5 mmHg MV Decel Time: 275 msec     TR Vmax:        209.00 cm/s MV E velocity: 72.00 cm/s MV A velocity: 111.00 cm/s  SHUNTS MV E/A ratio:  0.65         Systemic VTI:  0.25 m                             Systemic Diam:  2.00 cm Armanda Magic MD Electronically signed by Armanda Magic MD Signature Date/Time: 10/02/2019/9:52:20 AM    Final     Labs:  CBC: Recent Labs    10/01/19 1227  WBC 11.4*  HGB 16.6  HCT 48.6  PLT 269    COAGS: Recent Labs    10/01/19 1227  INR 1.0  APTT 29    BMP: Recent Labs    10/01/19 1227  NA 135  K 4.0  CL 103  CO2 23  GLUCOSE 245*  BUN 12  CALCIUM 9.1  CREATININE 0.76  GFRNONAA >60  GFRAA >60    LIVER FUNCTION TESTS: Recent Labs    10/01/19 1227  BILITOT 1.1  AST 14*  ALT 13  ALKPHOS 66  PROT 7.5  ALBUMIN 4.3     Assessment and Plan:  History of acute/subacute CVA (acute infarct of right insula and right frontal operculum), possibly caused by right ICA occlusion seen on CTA  head/neck 10/01/2019. Plan for image-guided diagnostic cerebral arteriogram tentatively for tomorrow in IR with Dr. Corliss Skains. Patient will be NPO at midnight. Afebrile. Ok to proceed with Plavix/Aspirin use per Dr. Corliss Skains; will hold Lovenox per IR protocol. INR 1.0 10/01/2019.  Risks and benefits of diagnostic cerebral arteriogram were discussed with the patient including, but not limited to bleeding, infection, vascular injury, stroke, or contrast induced renal failure. This interventional procedure involves the use of X-rays and because of the nature of the planned procedure, it is possible that we will have prolonged use of X-ray fluoroscopy. Potential radiation risks to you include (but are not limited to) the following: - A slightly elevated risk for cancer  several years later in life. This risk is typically less than 0.5% percent. This risk is low in comparison to the normal incidence of human cancer, which is 33% for women and 50% for men according to the American Cancer Society. - Radiation induced injury can include skin redness, resembling a rash, tissue breakdown / ulcers and hair loss (which can be temporary or permanent).  The likelihood of either of these occurring depends on the difficulty of the procedure and whether you are sensitive to radiation due to previous procedures, disease, or genetic conditions.  IF your procedure requires a prolonged use of radiation, you will be notified and given written instructions for further action.  It is your responsibility to monitor the irradiated area for the 2 weeks following the procedure and to notify your physician if you are concerned that you have suffered a radiation induced injury.   All of the patient's questions were answered, patient is agreeable to proceed. Consent signed and in chart.   Thank you for this interesting consult.  I greatly enjoyed meeting David Hendrix and look forward to participating in their care.  A copy of  this report was sent to the requesting provider on this date.  Electronically Signed: Elwin Mocha, PA-C 10/02/2019, 2:25 PM   I spent a total of 40 Minutes in face to face in clinical consultation, greater than 50% of which was counseling/coordinating care for right ICA occlusion/diagnostic cerebral arteriogram.

## 2019-10-02 NOTE — Evaluation (Signed)
Clinical/Bedside Swallow Evaluation Patient Details  Name: David Hendrix MRN: [id number] Date of Birth: Oct 07, 1955  Today's Date: 10/02/2019 Time: SLP Start Time (ACUTE ONLY): 0900 SLP Stop Time (ACUTE ONLY): 0915 SLP Time Calculation (min) (ACUTE ONLY): 15 min  Past Medical History:  Past Medical History:  Diagnosis Date  . Hypertension    HPI:  David Hendrix is a 64 y.o. male with history of untreated HTN and DM2,  presenting with left side weakness, slurred speech, drooling and unable to eat/drink x 3 days. He did not receive IV t-PA due to out side of time window. MRI shows 6.6 x 6.1 x 4.0 cm region of acute infarction affecting the right insula and right frontal operculum consistent with right MCA branch vessel infarction.    Assessment / Plan / Recommendation Clinical Impression  Pt demonstrates mild to moderate left CN VII weakness impacting strength of labial seal. Minimal POs given due to potential procedure later today. Pt was able to consume cup and straw sips of water without anterior spillage though he reported that he had been struggling with anterior spillage and potential oral residue prior to arrival (reported difficulty with taco salad). During assessment today, pt passed 3 oz water challenge without coughing. Recommend pt initiate necessary oral intake today (sips/meds whole in puree) prior to procedure and then may start a fine chop/dys 2 diet when appropriate  this evening with thin liquids. Will f/u tomorrow for futher diagnostic treatment with solids to address any oral dysphagia.  Pt demonstrates overt cognitive impairment, will address cognitive linguistic eval next session.  SLP Visit Diagnosis: Dysphagia, oral phase (R13.11)    Aspiration Risk  Mild aspiration risk    Diet Recommendation Dysphagia 2 (Fine chop);Thin liquid   Liquid Administration via: Cup;Straw Medication Administration: Whole meds with puree Supervision: Patient able to self  feed Compensations: Slow rate;Small sips/bites;Minimize environmental distractions Postural Changes: Seated upright at 90 degrees    Other  Recommendations Oral Care Recommendations: Oral care BID   Follow up Recommendations 24 hour supervision/assistance      Frequency and Duration min 2x/week  2 weeks       Prognosis Prognosis for Safe Diet Advancement: Good      Swallow Study   General HPI: David Hendrix is a 64 y.o. male with history of untreated HTN and DM2,  presenting with left side weakness, slurred speech, drooling and unable to eat/drink x 3 days. He did not receive IV t-PA due to out side of time window. MRI shows 6.6 x 6.1 x 4.0 cm region of acute infarction affecting the right insula and right frontal operculum consistent with right MCA branch vessel infarction.  Type of Study: Bedside Swallow Evaluation Previous Swallow Assessment: none Diet Prior to this Study: NPO Temperature Spikes Noted: No Respiratory Status: Room air History of Recent Intubation: No Behavior/Cognition: Alert;Cooperative;Pleasant mood Oral Cavity Assessment: Within Functional Limits Oral Care Completed by SLP: No Oral Cavity - Dentition: Dentures, top Self-Feeding Abilities: Able to feed self Patient Positioning: Upright in bed Baseline Vocal Quality: Normal Volitional Cough: Strong Volitional Swallow: Able to elicit    Oral/Motor/Sensory Function Overall Oral Motor/Sensory Function: Mild impairment Facial ROM: Reduced left;Suspected CN VII (facial) dysfunction Facial Symmetry: Abnormal symmetry left;Suspected CN VII (facial) dysfunction Facial Strength: Reduced left;Suspected CN VII (facial) dysfunction Lingual ROM: Reduced left;Suspected CN XII (hypoglossal) dysfunction Lingual Symmetry: Within Functional Limits Lingual Strength: Within Functional Limits Mandible: Within Functional Limits   Ice Chips Ice chips: Not tested   Thin  Liquid Thin Liquid: Within functional  limits Presentation: Cup;Straw    Nectar Thick Nectar Thick Liquid: Not tested   Honey Thick Honey Thick Liquid: Not tested   Puree Puree: Within functional limits Presentation: Self Fed;Spoon   Solid     Solid: Not tested     Herbie [place], [place] [facility] Pager [phone] Office [phone]  Lynann Beaver 10/02/2019,9:54 AM

## 2019-10-02 NOTE — Progress Notes (Signed)
  Echocardiogram 2D Echocardiogram has been performed.  [name] 10/02/2019, 8:28 AM

## 2019-10-02 NOTE — Consult Note (Signed)
Physical Medicine and Rehabilitation Consult Reason for Consult: Left side weakness and slurred speech Referring Physician: [facility]   HPI: David Hendrix is a 64 y.o. right-handed male with history of hypertension, diabetes mellitus noncompliant with medications due to insurance issues.  He presented on 10/01/2019 with left hemiparesis and dysarthria.  CT/MRI showing right brain infarcts.  Per report, 6.6 x 6.1 x 4.0 cm region of acute infarction affecting the right insula and right frontal operculum consistent with right MCA branch vessel infarction.  Punctate acute infarct also demonstrated in the medial right hypothalamus.  Old right occipital cortical and subcortical infarction.  CT angiogram of head and neck showed right internal carotid artery occlusion in the ICA bulb.  Echocardiogram with ejection fraction of 65% with grade 1 diastolic dysfunction.  Admission chemistries hemoglobin A1c 7.6, glucose 245.  Chest x-ray hazy streaky atelectasis or scarring at the bases.  Interventional radiology consulted with plans for cerebral arteriogram with results pending.  Currently on aspirin and Plavix for CVA prophylaxis.  Subcutaneous Lovenox for DVT prophylaxis.  Hospital course further complicated by dysphagia and started on a dysphagia #2 thin liquid diet.  Therapy evaluations completed with recommendations of physical medicine rehab consult.  Review of Systems  Constitutional: Negative for chills and fever.  HENT: Negative for hearing loss.   Eyes: Negative for blurred vision and double vision.  Respiratory: Negative for cough and shortness of breath.   Cardiovascular: Negative for chest pain, palpitations and leg swelling.  Gastrointestinal: Positive for constipation. Negative for heartburn and vomiting.  Genitourinary: Negative for dysuria, flank pain and hematuria.  Musculoskeletal: Positive for myalgias.  Skin: Negative for rash.  Neurological: Positive for dizziness and speech change.  Negative for sensory change and focal weakness.   Past Medical History:  Diagnosis Date  . Hypertension    Past medical history: Hypertension, diabetes mellitus type 2 Denies past surgical history No family history of premature CVA  Social History: Denies excessive alcohol use.  Denies tobacco and illicit drug use. Allergies: No Known Allergies No medications prior to admission.    Home: Home Living Family/patient expects to be discharged to:: Private residence Living Arrangements: Alone Available Help at Discharge: Other (Comment)(limited assistance) Type of Home: House Home Access: Level entry Home Layout: One level Bathroom Shower/Tub: (bathroom has toielt but no shower; pt showers outside with hose) Bathroom Toilet: Standard Home Equipment: None Additional Comments: son and daughter in law-- pt reports that he has a strained relationship with son so he is really on his own  Functional History: Prior Function Level of Independence: Independent Gait / Transfers Assistance Needed: pt reports that he lives on the same property as the business and David Hendrix ( son ) lives 5 minutes from that property ADL's / Homemaking Assistance Needed: outdoor shower setup by standing holding water hose overhead Comments: Independent with all adls and IADLs.  Normally works everyday on car transmissions. Functional Status:  Mobility: Bed Mobility Overal bed mobility: Needs Assistance Bed Mobility: Supine to Sit Supine to sit: Supervision Sit to supine: Supervision General bed mobility comments: cues for safety as pt is exiting without awareness to IV line or condom foley Transfers Overall transfer level: Needs assistance Equipment used: None Transfers: Sit to/from Stand Sit to Stand: Min guard Stand pivot transfers: Min guard General transfer comment: pt requires cues for safety and line management. pt requires instruction to stop at one point for safety and pt very distracted by internal  need to void Ambulation/Gait Ambulation/Gait  assistance: Min guard Gait Distance (Feet): 200 Feet Assistive device: None Gait Pattern/deviations: Decreased stride length, Drifts right/left General Gait Details: Decreased gait speed and tendency to drift left.  Had difficulty finding objects on L when walking.  He was able to identify large objects on L (people, beds, etc) and avoid narrowly. Gait velocity: decreased    ADL: ADL Overall ADL's : Needs assistance/impaired Eating/Feeding: NPO Eating/Feeding Details (indicate cue type and reason): pending angiogram - pt with no recall of several educations of this pending test. pt continues to request food Grooming: Wash/dry hands, Minimal assistance, Standing Grooming Details (indicate cue type and reason): cues for sequence Lower Body Bathing: Moderate assistance, Sit to/from stand Lower Body Bathing Details (indicate cue type and reason): peri care after voiding urine and needing to perform hygiene on legs Toilet Transfer: Minimal assistance Toilet Transfer Details (indicate cue type and reason): standing at commode to void. pt pulling condom cath completely off to void Toileting- Architect and Hygiene: Moderate assistance Functional mobility during ADLs: Minimal assistance General ADL Comments: pt positioned in dry chair with phone and blanket. pt states "i am cold natured" pt appears pleased up in chair. pt making comments several times during session that were inappropriate for interaction between therapist and patient. pt redirected each time to task and cued to not appropriate.   Cognition: Cognition Overall Cognitive Status: No family/caregiver present to determine baseline cognitive functioning Orientation Level: Oriented X4 Cognition Arousal/Alertness: Awake/alert Behavior During Therapy: Flat affect, Impulsive Overall Cognitive Status: No family/caregiver present to determine baseline cognitive functioning Area of  Impairment: Memory, Following commands, Safety/judgement, Awareness, Problem solving Memory: Decreased short-term memory, Decreased recall of precautions Following Commands: Follows multi-step commands inconsistently Safety/Judgement: Decreased awareness of safety, Decreased awareness of deficits Awareness: Emergent Problem Solving: Slow processing, Difficulty sequencing General Comments: pt unable to unlock cell phone but able to state "code [id number]" > Pt speaking of grandson and attempting to show photo. pt with extended time navigates to facebook but started to type message with what he was looking for instead of locating photo. Pt with incontinence of urine due to condom cath d/ced and no awareness. pt asked if his gown was wet adn responds yes. Pt fixated on taking a shower even after education of IV and condom cath. Pt peeing on the floor of bathroom and walking through it with statement "thats wet right here"   Blood pressure 126/76, pulse 69, temperature (!) 97.5 F (36.4 C), temperature source Oral, resp. rate 14, height  (1.778 m), weight 95.3 kg, SpO2 99 %. Physical Exam  Vitals reviewed. Constitutional: He appears well-developed.  Obese  HENT:  Head: Normocephalic and atraumatic.  Eyes: EOM are normal. Right eye exhibits no discharge. Left eye exhibits no discharge.  Neck: No tracheal deviation present. No thyromegaly present.  Respiratory: Effort normal. No stridor. No respiratory distress.  GI: Soft. He exhibits no distension.  Musculoskeletal:     Comments: No edema or tenderness in extremities  Neurological: He is alert.  Patient is alert left facial droop.   Dysarthria Follows commands.   Provides his name and age. Motor: 5/5 throughout  Skin: Skin is warm and dry.  Psychiatric:  Irritable    Results for orders placed or performed during the hospital encounter of 10/01/19 (from the past 24 hour(s))  Glucose, capillary     Status: Abnormal   Collection Time:  10/02/19  5:10 PM  Result Value Ref Range   Glucose-Capillary 157 (H) 70 - 99  mg/dL  Glucose, capillary     Status: Abnormal   Collection Time: 10/02/19  8:03 PM  Result Value Ref Range   Glucose-Capillary 149 (H) 70 - 99 mg/dL  Glucose, capillary     Status: Abnormal   Collection Time: 10/02/19  9:38 PM  Result Value Ref Range   Glucose-Capillary 187 (H) 70 - 99 mg/dL  CBC with Differential/Platelet     Status: None   Collection Time: 10/03/19  5:05 AM  Result Value Ref Range   WBC 9.9 4.0 - 10.5 K/uL   RBC 5.02 4.22 - 5.81 MIL/uL   Hemoglobin 15.7 13.0 - 17.0 g/dL   HCT 49.7 02.6 - 37.8 %   MCV 92.2 80.0 - 100.0 fL   MCH 31.3 26.0 - 34.0 pg   MCHC 33.9 30.0 - 36.0 g/dL   RDW 58.8 50.2 - 77.4 %   Platelets 253 150 - 400 K/uL   nRBC 0.0 0.0 - 0.2 %   Neutrophils Relative % 61 %   Neutro Abs 6.1 1.7 - 7.7 K/uL   Lymphocytes Relative 28 %   Lymphs Abs 2.8 0.7 - 4.0 K/uL   Monocytes Relative 8 %   Monocytes Absolute 0.8 0.1 - 1.0 K/uL   Eosinophils Relative 2 %   Eosinophils Absolute 0.2 0.0 - 0.5 K/uL   Basophils Relative 1 %   Basophils Absolute 0.1 0.0 - 0.1 K/uL   Immature Granulocytes 0 %   Abs Immature Granulocytes 0.02 0.00 - 0.07 K/uL  Basic metabolic panel     Status: Abnormal   Collection Time: 10/03/19  5:05 AM  Result Value Ref Range   Sodium 139 135 - 145 mmol/L   Potassium 3.8 3.5 - 5.1 mmol/L   Chloride 106 98 - 111 mmol/L   CO2 24 22 - 32 mmol/L   Glucose, Bld 135 (H) 70 - 99 mg/dL   BUN 8 8 - 23 mg/dL   Creatinine, Ser 1.28 0.61 - 1.24 mg/dL   Calcium 8.9 8.9 - 78.6 mg/dL   GFR calc non Af Amer >60 >60 mL/min   GFR calc Af Amer >60 >60 mL/min   Anion gap 9 5 - 15  Magnesium     Status: None   Collection Time: 10/03/19  5:05 AM  Result Value Ref Range   Magnesium 2.2 1.7 - 2.4 mg/dL  TSH     Status: None   Collection Time: 10/03/19  5:05 AM  Result Value Ref Range   TSH 4.178 0.350 - 4.500 uIU/mL  Glucose, capillary     Status: Abnormal    Collection Time: 10/03/19  6:19 AM  Result Value Ref Range   Glucose-Capillary 138 (H) 70 - 99 mg/dL  Glucose, capillary     Status: Abnormal   Collection Time: 10/03/19  7:44 AM  Result Value Ref Range   Glucose-Capillary 146 (H) 70 - 99 mg/dL  Glucose, capillary     Status: Abnormal   Collection Time: 10/03/19 11:46 AM  Result Value Ref Range   Glucose-Capillary 125 (H) 70 - 99 mg/dL   CT ANGIO HEAD W OR WO CONTRAST  Addendum Date: 10/01/2019   ADDENDUM REPORT: 10/01/2019 18:55 ADDENDUM: Aortic Atherosclerosis (ICD10-I70.0) and Emphysema (ICD10-J43.9). 5 mm pulmonary nodule right upper lobe. Non-contrast chest CT can be considered in 12 months if patient is high-risk, which appears to be the case based on the emphysema. This recommendation follows the consensus statement: Guidelines for Management of Incidental Pulmonary Nodules Detected on CT Images: From  the Fleischner Society 2017; Radiology 2017; [phone]. Electronically Signed   By: Paulina Fusi M.D.   On: 10/01/2019 18:55   Result Date: 10/01/2019 CLINICAL DATA:  Weakness and facial droop. Difficulty swallowing. Acute infarction right frontal lobe. Abnormal flow right internal carotid artery. EXAM: CT ANGIOGRAPHY HEAD AND NECK TECHNIQUE: Multidetector CT imaging of the head and neck was performed using the standard protocol during bolus administration of intravenous contrast. Multiplanar CT image reconstructions and MIPs were obtained to evaluate the vascular anatomy. Carotid stenosis measurements (when applicable) are obtained utilizing NASCET criteria, using the distal internal carotid diameter as the denominator. CONTRAST:  64mL OMNIPAQUE IOHEXOL 350 MG/ML SOLN COMPARISON:  CT and MRI same day FINDINGS: Aortic atherosclerosis. Branching pattern is normal without origin stenosis. CTA NECK FINDINGS Aortic arch: Aortic atherosclerosis. Branching pattern is normal without origin stenosis. Right carotid system: Common carotid artery widely  patent to the bifurcation. Occlusion of the ICA in the bulb. No reconstituted flow seen in the cervical region. Left carotid system: Common carotid artery widely patent to the bifurcation. Calcified and soft plaque at the carotid bifurcation and ICA bulb but no stenosis compared to the more distal cervical ICA diameter. Vertebral arteries: Both vertebral artery origins are widely patent. The vertebral arteries are patent through the cervical region to the foramen magnum without stenosis greater than 30%. Skeleton: Ordinary cervical spondylosis and facet arthropathy. Other neck: No mass or lymphadenopathy. Upper chest: Emphysema and pulmonary scarring. 5 mm pulmonary nodule in the right upper lobe. Review of the MIP images confirms the above findings CTA HEAD FINDINGS Anterior circulation: Right internal carotid artery does not show any flow through the skull base. Reconstituted in flow at the carotid terminus primarily on the basis of a patent anterior communicating and posterior communicating arteries. Left internal carotid artery is patent through the siphon region without stenosis greater than 30%. No large or medium vessel occlusion seen on the left. On the right, there is a missing M2 to M3 branch serving the frontal operculum. Posterior circulation: Both vertebral arteries widely patent through the foramen magnum. Right vertebral terminates in PICA. Left vertebral artery supplies the basilar. No basilar stenosis. Posterior circulation branch vessels are patent. Distal branch vessels do show atherosclerotic irregularity. Venous sinuses: Patent and normal. Anatomic variants: None significant. Review of the MIP images confirms the above findings IMPRESSION: Right internal carotid artery occlusion in the ICA bulb. Flow to the anterior circulation on the right occurs by patent anterior and posterior communicating arteries. I do not see significant external to internal collateral flow. Apparent missing M2 to M3  branch serving the right frontal operculum infarction region. Electronically Signed: By: Paulina Fusi M.D. On: 10/01/2019 18:46   DG Chest 2 View  Result Date: 10/01/2019 CLINICAL DATA:  Hypertension CVA EXAM: CHEST - 2 VIEW COMPARISON:  None. FINDINGS: No focal airspace disease or pleural effusion. Borderline cardiomegaly. Hazy/streaky atelectasis or scarring at the bases. No pneumothorax. IMPRESSION: Hazy/streaky atelectasis or scarring at the bases. Electronically Signed   By: Jasmine Pang M.D.   On: 10/01/2019 17:01   CT ANGIO NECK W OR WO CONTRAST  Addendum Date: 10/01/2019   ADDENDUM REPORT: 10/01/2019 18:55 ADDENDUM: Aortic Atherosclerosis (ICD10-I70.0) and Emphysema (ICD10-J43.9). 5 mm pulmonary nodule right upper lobe. Non-contrast chest CT can be considered in 12 months if patient is high-risk, which appears to be the case based on the emphysema. This recommendation follows the consensus statement: Guidelines for Management of Incidental Pulmonary Nodules Detected on CT Images: From  the Fleischner Society 2017; Radiology 2017; [phone]. Electronically Signed   By: Paulina Fusi M.D.   On: 10/01/2019 18:55   Result Date: 10/01/2019 CLINICAL DATA:  Weakness and facial droop. Difficulty swallowing. Acute infarction right frontal lobe. Abnormal flow right internal carotid artery. EXAM: CT ANGIOGRAPHY HEAD AND NECK TECHNIQUE: Multidetector CT imaging of the head and neck was performed using the standard protocol during bolus administration of intravenous contrast. Multiplanar CT image reconstructions and MIPs were obtained to evaluate the vascular anatomy. Carotid stenosis measurements (when applicable) are obtained utilizing NASCET criteria, using the distal internal carotid diameter as the denominator. CONTRAST:  75mL OMNIPAQUE IOHEXOL 350 MG/ML SOLN COMPARISON:  CT and MRI same day FINDINGS: Aortic atherosclerosis. Branching pattern is normal without origin stenosis. CTA NECK FINDINGS Aortic  arch: Aortic atherosclerosis. Branching pattern is normal without origin stenosis. Right carotid system: Common carotid artery widely patent to the bifurcation. Occlusion of the ICA in the bulb. No reconstituted flow seen in the cervical region. Left carotid system: Common carotid artery widely patent to the bifurcation. Calcified and soft plaque at the carotid bifurcation and ICA bulb but no stenosis compared to the more distal cervical ICA diameter. Vertebral arteries: Both vertebral artery origins are widely patent. The vertebral arteries are patent through the cervical region to the foramen magnum without stenosis greater than 30%. Skeleton: Ordinary cervical spondylosis and facet arthropathy. Other neck: No mass or lymphadenopathy. Upper chest: Emphysema and pulmonary scarring. 5 mm pulmonary nodule in the right upper lobe. Review of the MIP images confirms the above findings CTA HEAD FINDINGS Anterior circulation: Right internal carotid artery does not show any flow through the skull base. Reconstituted in flow at the carotid terminus primarily on the basis of a patent anterior communicating and posterior communicating arteries. Left internal carotid artery is patent through the siphon region without stenosis greater than 30%. No large or medium vessel occlusion seen on the left. On the right, there is a missing M2 to M3 branch serving the frontal operculum. Posterior circulation: Both vertebral arteries widely patent through the foramen magnum. Right vertebral terminates in PICA. Left vertebral artery supplies the basilar. No basilar stenosis. Posterior circulation branch vessels are patent. Distal branch vessels do show atherosclerotic irregularity. Venous sinuses: Patent and normal. Anatomic variants: None significant. Review of the MIP images confirms the above findings IMPRESSION: Right internal carotid artery occlusion in the ICA bulb. Flow to the anterior circulation on the right occurs by patent  anterior and posterior communicating arteries. I do not see significant external to internal collateral flow. Apparent missing M2 to M3 branch serving the right frontal operculum infarction region. Electronically Signed: By: Paulina Fusi M.D. On: 10/01/2019 18:46   MR BRAIN WO CONTRAST  Result Date: 10/01/2019 CLINICAL DATA:  Hypertension and diabetes. Difficulty swallowing. Weakness. Abnormal head CT right frontal region. EXAM: MRI HEAD WITHOUT CONTRAST TECHNIQUE: Multiplanar, multiecho pulse sequences of the brain and surrounding structures were obtained without intravenous contrast. COMPARISON:  Head CT same day FINDINGS: Brain: There is acute infarction in the right insula and frontal operculum with the region measuring approximately 6.6 x 6.1 x 4.0 cm. Swelling of the region of infarction but without hemorrhage. Punctate acute infarction in the medial right hypothalamus. No focal abnormality affects the brainstem or cerebellum. Cerebral hemispheres otherwise show moderate chronic small-vessel changes of the deep and subcortical white matter. There is an old cortical and subcortical infarction in the right occipital lobe. No midline shift. No hydrocephalus. No extra-axial collection. Vascular:  Abnormal flow pattern in the right internal carotid artery. There could be right internal carotid artery occlusion or very slow flow. Skull and upper cervical spine: Negative Sinuses/Orbits: Mild seasonal mucosal inflammatory changes of the sinuses. Orbits negative. Other: None IMPRESSION: 6.6 x 6.1 x 4.0 cm region of acute infarction affecting the right insula and right frontal operculum consistent with right MCA branch vessel infarction. Slow flow or no flow in the right ICA at the skull base. The infarction shows mild swelling but there is no mass effect or shift. No sign of blood products within the infarction. Punctate acute infarction also demonstrated in the medial right hypothalamus. Background pattern of  moderate chronic small-vessel ischemic changes affecting the cerebral hemispheric white matter. Old right occipital cortical and subcortical infarction. Electronically Signed   By: Paulina Fusi M.D.   On: 10/01/2019 17:42   ECHOCARDIOGRAM COMPLETE  Result Date: 10/02/2019    ECHOCARDIOGRAM REPORT   Patient Name:   GEARY RUFO Date of Exam: 10/02/2019 Medical Rec #:  [id number]       Height:       70.0 in Accession #:    [id number]      Weight:       210.0 lb Date of Birth:  18-Jan-1956       BSA:          2.131 m Patient Age:    63 years        BP:           136/54 mmHg Patient Gender: M               HR:           65 bpm. Exam Location:  Inpatient Procedure: 2D Echo Indications:    TIA 435.9 / G45.9  History:        Patient has no prior history of Echocardiogram examinations. No                 prior cardiac history.  Sonographer:    Leeroy Bock Turrentine Referring Phys: [id number] [place] General  Sonographer Comments: Poor patient compliance. IMPRESSIONS  1. Left ventricular ejection fraction, by estimation, is 60 to 65%. The left ventricle has normal function. The left ventricle has no regional wall motion abnormalities. There is mild concentric left ventricular hypertrophy. Left ventricular diastolic parameters are consistent with Grade I diastolic dysfunction (impaired relaxation).  2. Right ventricular systolic function is normal. The right ventricular size is normal. There is normal pulmonary artery systolic pressure.  3. The mitral valve is normal in structure. Trivial mitral valve regurgitation. No evidence of mitral stenosis.  4. The aortic valve is normal in structure. Aortic valve regurgitation is mild. No aortic stenosis is present.  5. The inferior vena cava is normal in size with greater than 50% respiratory variability, suggesting right atrial pressure of 3 mmHg. FINDINGS  Left Ventricle: Left ventricular ejection fraction, by estimation, is 60 to 65%. The left ventricle has normal function. The left  ventricle has no regional wall motion abnormalities. The left ventricular internal cavity size was normal in size. There is  mild concentric left ventricular hypertrophy. Left ventricular diastolic parameters are consistent with Grade I diastolic dysfunction (impaired relaxation). Normal left ventricular filling pressure. Right Ventricle: The right ventricular size is normal. No increase in right ventricular wall thickness. Right ventricular systolic function is normal. There is normal pulmonary artery systolic pressure. The tricuspid regurgitant velocity is 2.09 m/s, and  with an assumed right atrial pressure  of 8 mmHg, the estimated right ventricular systolic pressure is 41.6 mmHg. Left Atrium: Left atrial size was normal in size. Right Atrium: Right atrial size was normal in size. Pericardium: There is no evidence of pericardial effusion. Mitral Valve: The mitral valve is normal in structure. Normal mobility of the mitral valve leaflets. Trivial mitral valve regurgitation. No evidence of mitral valve stenosis. Tricuspid Valve: The tricuspid valve is normal in structure. Tricuspid valve regurgitation is mild . No evidence of tricuspid stenosis. Aortic Valve: The aortic valve is normal in structure. Aortic valve regurgitation is mild. No aortic stenosis is present. Aortic valve mean gradient measures 5.0 mmHg. Aortic valve peak gradient measures 7.7 mmHg. Aortic valve area, by VTI measures 2.37 cm. Pulmonic Valve: The pulmonic valve was normal in structure. Pulmonic valve regurgitation is trivial. No evidence of pulmonic stenosis. Aorta: The aortic root is normal in size and structure. Venous: The inferior vena cava is normal in size with greater than 50% respiratory variability, suggesting right atrial pressure of 3 mmHg. IAS/Shunts: No atrial level shunt detected by color flow Doppler.  LEFT VENTRICLE PLAX 2D LVIDd:         4.60 cm  Diastology LVIDs:         3.09 cm  LV e' lateral:   9.68 cm/s LV PW:          1.11 cm  LV E/e' lateral: 7.4 LV IVS:        1.11 cm  LV e' medial:    7.72 cm/s LVOT diam:     2.00 cm  LV E/e' medial:  9.3 LV SV:         78 LV SV Index:   36 LVOT Area:     3.14 cm  RIGHT VENTRICLE RV S prime:     11.30 cm/s TAPSE (M-mode): 2.1 cm LEFT ATRIUM             Index       RIGHT ATRIUM           Index LA diam:        3.40 cm 1.60 cm/m  RA Area:     16.30 cm LA Vol (A2C):   36.4 ml 17.08 ml/m RA Volume:   43.70 ml  20.51 ml/m LA Vol (A4C):   40.3 ml 18.91 ml/m LA Biplane Vol: 41.1 ml 19.29 ml/m  AORTIC VALVE AV Area (Vmax):    2.62 cm AV Area (Vmean):   2.37 cm AV Area (VTI):     2.37 cm AV Vmax:           139.00 cm/s AV Vmean:          105.000 cm/s AV VTI:            0.327 m AV Peak Grad:      7.7 mmHg AV Mean Grad:      5.0 mmHg LVOT Vmax:         116.00 cm/s LVOT Vmean:        79.100 cm/s LVOT VTI:          0.247 m LVOT/AV VTI ratio: 0.76  AORTA Ao Root diam: 3.50 cm MITRAL VALVE                TRICUSPID VALVE MV Area (PHT): 2.76 cm     TR Peak grad:   17.5 mmHg MV Decel Time: 275 msec     TR Vmax:        209.00 cm/s MV E velocity:  72.00 cm/s MV A velocity: 111.00 cm/s  SHUNTS MV E/A ratio:  0.65         Systemic VTI:  0.25 m                             Systemic Diam: 2.00 cm Armanda Magic MD Electronically signed by Armanda Magic MD Signature Date/Time: 10/02/2019/9:52:20 AM    Final    VAS US CAROTID  Result Date: 10/02/2019 Carotid Arterial Duplex Study Indications:   CVA and Carotid artery disease. Other Factors: CT showed occluded ICA. Performing Technologist: Jeb Levering RDMS, RVT  Examination Guidelines: A complete evaluation includes B-mode imaging, spectral Doppler, color Doppler, and power Doppler as needed of all accessible portions of each vessel. Bilateral testing is considered an integral part of a complete examination. Limited examinations for reoccurring indications may be performed as noted.  Right Carotid Findings:  +----------+--------+--------+--------+------------------+---------------------+           PSV cm/sEDV cm/sStenosisPlaque DescriptionComments              +----------+--------+--------+--------+------------------+---------------------+ CCA Prox  81      9                                                       +----------+--------+--------+--------+------------------+---------------------+ CCA Distal42      5                                                       +----------+--------+--------+--------+------------------+---------------------+ ICA Prox  99              Occluded                  near occlusion,                                                           minimal flow noted                                                        proximally            +----------+--------+--------+--------+------------------+---------------------+ ECA       183     19                                                      +----------+--------+--------+--------+------------------+---------------------+ +----------+--------+-------+--------+-------------------+           PSV cm/sEDV cmsDescribeArm Pressure (mmHG) +----------+--------+-------+--------+-------------------+ [id number]            Stenotic                    +----------+--------+-------+--------+-------------------+ +---------+--------+--+--------+-+---------+  VertebralPSV cm/s74EDV cm/s5Antegrade +---------+--------+--+--------+-+---------+  Left Carotid Findings: +----------+--------+--------+--------+------------------+--------+           PSV cm/sEDV cm/sStenosisPlaque DescriptionComments +----------+--------+--------+--------+------------------+--------+ CCA Prox  96      19                                         +----------+--------+--------+--------+------------------+--------+ CCA Distal69      21                                          +----------+--------+--------+--------+------------------+--------+ ICA Prox  58      21      1-39%   heterogenous               +----------+--------+--------+--------+------------------+--------+ ICA Distal107     30                                         +----------+--------+--------+--------+------------------+--------+ ECA       169     11                                         +----------+--------+--------+--------+------------------+--------+ +----------+--------+--------+----------------+-------------------+           PSV cm/sEDV cm/sDescribe        Arm Pressure (mmHG) +----------+--------+--------+----------------+-------------------+ [id number]             Multiphasic, WNL                    +----------+--------+--------+----------------+-------------------+ +---------+--------+--+--------+--+---------+ VertebralPSV cm/s91EDV cm/s29Antegrade +---------+--------+--+--------+--+---------+   Summary: Right Carotid: Evidence consistent with a total occlusion of the right ICA. Left Carotid: Velocities in the left ICA are consistent with a 1-39% stenosis. Vertebrals:  Bilateral vertebral arteries demonstrate antegrade flow. Subclavians: Normal flow hemodynamics were seen in bilateral subclavian              arteries. *See table(s) above for measurements and observations.  Electronically signed by Delia HeadyPramod Sethi MD on 10/02/2019 at 5:22:55 PM.    Final     Assessment/Plan: Diagnosis: Right MCA infarct Stroke: Continue secondary stroke prophylaxis and Risk Factor Modification listed below:   Antiplatelet therapy:   Blood Pressure Management:  Continue current medication with prn's with permisive HTN per primary team Statin Agent:   Diabetes management:   Labs independently reviewed.  Records reviewed and summated above.  1. Does the need for close, 24 hr/day medical supervision in concert with the patient's rehab needs make it unreasonable for this patient to  be served in a less intensive setting? No 2. Co-Morbidities requiring supervision/potential complications: HTN (monitor and provide prns in accordance with increased physical exertion and pain), DM with hyperglycemia (Monitor in accordance with exercise and adjust meds as necessary), noncompliance due to financial limitations (social worker to assist) 3. Due to safety, disease management and patient education, does the patient require 24 hr/day rehab nursing? No 4. Does the patient require coordinated care of a physician, rehab nurse, therapy disciplines of PT/OT/SLP to address physical and functional deficits in the context of the above medical diagnosis(es)? No Addressing deficits in the following areas: balance, endurance, locomotion, transferring, bathing,  dressing, toileting, cognition and psychosocial support 5. Can the patient actively participate in an intensive therapy program of at least 3 hrs of therapy per day at least 5 days per week? Yes 6. The potential for patient to make measurable gains while on inpatient rehab is good and fair 7. Anticipated functional outcomes upon discharge from inpatient rehab are modified independent and supervision  with PT, modified independent and supervision with OT, modified independent and supervision with SLP. 8. Estimated rehab length of stay to reach the above functional goals is: 3-4 days. 9. Anticipated discharge destination: Home 10. Overall Rehab/Functional Prognosis: good  RECOMMENDATIONS: This patient's condition is appropriate for continued rehabilitative care in the following setting: Patient doing functionally very well on day of evaluation.  Anticipate patient will continue to make functional gains and will not require CIR.  Further, patient refusing CIR and would like to be discharged home with home health.  Believe this is appropriate.  Recommend PM&R outpatient follow-up. Patient has agreed to participate in recommended program. Yes Note  that insurance prior authorization may be required for reimbursement for recommended care.  Comment: Rehab Admissions Coordinator to follow up.  I have personally performed a face to face diagnostic evaluation, including, but not limited to relevant history and physical exam findings, of this patient and developed relevant assessment and plan.  Additionally, I have reviewed and concur with the physician assistant's documentation above.   Maryla Morrow, MD, ABPMR David Hendrix Angiulli, PA-C 10/03/2019

## 2019-10-02 NOTE — Progress Notes (Signed)
PROGRESS NOTE  Younis Mathey IRC:[id number] DOB: 1955/11/19 DOA: 10/01/2019 PCP: Patient, No Pcp Per  Chief Complaint: Drooling and trouble swallowing  HPI: Nigil Braman is a 64 y.o. male with medical history significant of HTN and IIDM, but non-compliant with medications due to insurance issue.  Patient started to feel sick 3 days ago, with some trouble swallowing food and water think the food and water just slipped off the corner of his lips, but no significant coughing or choking no significant weakness at that point.  Today, while in the laundromat patient was told by a bystander that he has left facial droop as sign of stroke and he was brought to the ER for evaluation.  Patient denied any weakness of any of the limbs were numb or tingling of any of the limbs, no blurred vision or hearing changes no headache. ED Course: CT shows right frontal acute stroke   HPI/Recap of past 24 hours:  Continue to have left facial droop Denies pain,  strength in bilateral extremity intact and equal  He is texting on the phone entire time during my encounter, he does not appear to have good insight into his disease process and associated deficit and safety issues  He wants to eat and want to take a shower  Assessment/Plan: Active Problems:   CVA (cerebral vascular accident) ([place])  Acute CVA resulting facial droop and trouble holding foot in mouth Acte cva likely  Embolic appearing right frontal stroke d/t vessel disease source per neurology MRI  Large right frontal stroke Echo cardiogram no embolic source reported, LVEF 60% with grade 1 diastolic dysfunction CTA head & neck ICA occlusion-likely acute. IR consulted He is currently on asa/lipitor Neurology/IR  input appreciated   Diet modification per speech: Dysphagia 2 (Fine chop);Thin liquid  Liquid Administration via: Cup;Straw Medication Administration: Whole meds with puree Supervision: Patient able to self feed Compensations:  Slow rate;Small sips/bites;Minimize environmental distractions Postural Changes: Seated upright at 90 degrees   Per PT, although patient strength is intact he does has decreased coordination decreased balance and decreased safety awareness  Uncontrolled type 2 diabetes base hyperglycemia -A1c 7.6 -He was previously not taking any medication, currently on SSI -Consider start Metformin at discharge -Diet and diabetes education  HLD Started lipitor  Cigarette smoking; smoking cessation education provided, he declined nicotine patch  DVT Prophylaxis: Subcu Lovenox  Code Status: full  Family Communication: patient   Disposition Plan:    Patient came from:                 home                                                                                         Anticipated d/c place: CIR?  Barriers to d/c OR conditions which need to be met to effect a safe d/c:  Needs angiogram,    Consultants:  Neurology  Neuro interventionalist   Procedures:  Angiogram  Antibiotics:  None   Objective: BP (!) 148/75 (BP Location: Left Arm)   Pulse 72   Temp 99 F (37.2 C) (Oral)   Resp 18   Ht 5' 10"  (  1.778 m)   Wt 95.3 kg   SpO2 98%   BMI 30.13 kg/m   Intake/Output Summary (Last 24 hours) at 10/02/2019 1252 Last data filed at 10/02/2019 1145 Gross per 24 hour  Intake 785.76 ml  Output 700 ml  Net 85.76 ml   Filed Weights   10/01/19 1217  Weight: 95.3 kg    Exam: Patient is examined daily including today on 10/02/2019, exams remain the same as of yesterday except that has changed    General:  NAD  Cardiovascular: RRR  Respiratory: CTABL  Abdomen: Soft/ND/NT, positive BS  Musculoskeletal: No Edema  Neuro: alert, oriented , left facial droop  Data Reviewed: Basic Metabolic Panel: Recent Labs  Lab 10/01/19 1227  NA 135  K 4.0  CL 103  CO2 23  GLUCOSE 245*  BUN 12  CREATININE 0.76  CALCIUM 9.1   Liver Function Tests: Recent Labs  Lab  10/01/19 1227  AST 14*  ALT 13  ALKPHOS 66  BILITOT 1.1  PROT 7.5  ALBUMIN 4.3   No results for input(s): LIPASE, AMYLASE in the last 168 hours. No results for input(s): AMMONIA in the last 168 hours. CBC: Recent Labs  Lab 10/01/19 1227  WBC 11.4*  NEUTROABS 7.8*  HGB 16.6  HCT 48.6  MCV 93.1  PLT 269   Cardiac Enzymes:   No results for input(s): CKTOTAL, CKMB, CKMBINDEX, TROPONINI in the last 168 hours. BNP (last 3 results) No results for input(s): BNP in the last 8760 hours.  ProBNP (last 3 results) No results for input(s): PROBNP in the last 8760 hours.  CBG: Recent Labs  Lab 10/01/19 1943 10/01/19 2301 10/02/19 0530 10/02/19 0839 10/02/19 1144  GLUCAP 114* 117* 145* 139* 149*    Recent Results (from the past 240 hour(s))  Respiratory Panel by RT PCR (Flu A&B, Covid) - Nasopharyngeal Swab     Status: None   Collection Time: 10/01/19  4:30 PM   Specimen: Nasopharyngeal Swab  Result Value Ref Range Status   SARS Coronavirus 2 by RT PCR NEGATIVE NEGATIVE Final    Comment: (NOTE) SARS-CoV-2 target nucleic acids are NOT DETECTED. The SARS-CoV-2 RNA is generally detectable in upper respiratoy specimens during the acute phase of infection. The lowest concentration of SARS-CoV-2 viral copies this assay can detect is 131 copies/mL. A negative result does not preclude SARS-Cov-2 infection and should not be used as the sole basis for treatment or other patient management decisions. A negative result may occur with  improper specimen collection/handling, submission of specimen other than nasopharyngeal swab, presence of viral mutation(s) within the areas targeted by this assay, and inadequate number of viral copies (<131 copies/mL). A negative result must be combined with clinical observations, patient history, and epidemiological information. The expected result is Negative. Fact Sheet for Patients:  PinkCheek.be Fact Sheet for  Healthcare Providers:  GravelBags.it This test is not yet ap proved or cleared by the [place] FDA and  has been authorized for detection and/or diagnosis of SARS-CoV-2 by FDA under an Emergency Use Authorization (EUA). This EUA will remain  in effect (meaning this test can be used) for the duration of the COVID-19 declaration under Section 564(b)(1) of the Act, 21 U.S.C. section 360bbb-3(b)(1), unless the authorization is terminated or revoked sooner.    Influenza A by PCR NEGATIVE NEGATIVE Final   Influenza B by PCR NEGATIVE NEGATIVE Final    Comment: (NOTE) The Xpert Xpress SARS-CoV-2/FLU/RSV assay is intended as an aid in  the diagnosis of  influenza from Nasopharyngeal swab specimens and  should not be used as a sole basis for treatment. Nasal washings and  aspirates are unacceptable for Xpert Xpress SARS-CoV-2/FLU/RSV  testing. Fact Sheet for Patients: PinkCheek.be Fact Sheet for Healthcare Providers: GravelBags.it This test is not yet approved or cleared by the [place] FDA and  has been authorized for detection and/or diagnosis of SARS-CoV-2 by  FDA under an Emergency Use Authorization (EUA). This EUA will remain  in effect (meaning this test can be used) for the duration of the  Covid-19 declaration under Section 564(b)(1) of the Act, 21  U.S.C. section 360bbb-3(b)(1), unless the authorization is  terminated or revoked. Performed at [facility], [place] [address] [place], [place] [address]      Studies: CT ANGIO HEAD W OR WO CONTRAST  Addendum Date: 10/01/2019   ADDENDUM REPORT: 10/01/2019 18:55 ADDENDUM: Aortic Atherosclerosis (ICD10-I70.0) and Emphysema (ICD10-J43.9). 5 mm pulmonary nodule right upper lobe. Non-contrast chest CT can be considered in 12 months if patient is high-risk, which appears to be the case based on the emphysema. This recommendation follows the  consensus statement: Guidelines for Management of Incidental Pulmonary Nodules Detected on CT Images: From the Fleischner Society 2017; Radiology 2017; 284:228-243. Electronically Signed   By: Nelson Chimes M.D.   On: 10/01/2019 18:55   Result Date: 10/01/2019 CLINICAL DATA:  Weakness and facial droop. Difficulty swallowing. Acute infarction right frontal lobe. Abnormal flow right internal carotid artery. EXAM: CT ANGIOGRAPHY HEAD AND NECK TECHNIQUE: Multidetector CT imaging of the head and neck was performed using the standard protocol during bolus administration of intravenous contrast. Multiplanar CT image reconstructions and MIPs were obtained to evaluate the vascular anatomy. Carotid stenosis measurements (when applicable) are obtained utilizing NASCET criteria, using the distal internal carotid diameter as the denominator. CONTRAST:  24m OMNIPAQUE IOHEXOL 350 MG/ML SOLN COMPARISON:  CT and MRI same day FINDINGS: Aortic atherosclerosis. Branching pattern is normal without origin stenosis. CTA NECK FINDINGS Aortic arch: Aortic atherosclerosis. Branching pattern is normal without origin stenosis. Right carotid system: Common carotid artery widely patent to the bifurcation. Occlusion of the ICA in the bulb. No reconstituted flow seen in the cervical region. Left carotid system: Common carotid artery widely patent to the bifurcation. Calcified and soft plaque at the carotid bifurcation and ICA bulb but no stenosis compared to the more distal cervical ICA diameter. Vertebral arteries: Both vertebral artery origins are widely patent. The vertebral arteries are patent through the cervical region to the foramen magnum without stenosis greater than 30%. Skeleton: Ordinary cervical spondylosis and facet arthropathy. Other neck: No mass or lymphadenopathy. Upper chest: Emphysema and pulmonary scarring. 5 mm pulmonary nodule in the right upper lobe. Review of the MIP images confirms the above findings CTA HEAD FINDINGS  Anterior circulation: Right internal carotid artery does not show any flow through the skull base. Reconstituted in flow at the carotid terminus primarily on the basis of a patent anterior communicating and posterior communicating arteries. Left internal carotid artery is patent through the siphon region without stenosis greater than 30%. No large or medium vessel occlusion seen on the left. On the right, there is a missing M2 to M3 branch serving the frontal operculum. Posterior circulation: Both vertebral arteries widely patent through the foramen magnum. Right vertebral terminates in PICA. Left vertebral artery supplies the basilar. No basilar stenosis. Posterior circulation branch vessels are patent. Distal branch vessels do show atherosclerotic irregularity. Venous sinuses: Patent and normal. Anatomic variants: None significant. Review  of the MIP images confirms the above findings IMPRESSION: Right internal carotid artery occlusion in the ICA bulb. Flow to the anterior circulation on the right occurs by patent anterior and posterior communicating arteries. I do not see significant external to internal collateral flow. Apparent missing M2 to M3 branch serving the right frontal operculum infarction region. Electronically Signed: By: Nelson Chimes M.D. On: 10/01/2019 18:46   DG Chest 2 View  Result Date: 10/01/2019 CLINICAL DATA:  Hypertension CVA EXAM: CHEST - 2 VIEW COMPARISON:  None. FINDINGS: No focal airspace disease or pleural effusion. Borderline cardiomegaly. Hazy/streaky atelectasis or scarring at the bases. No pneumothorax. IMPRESSION: Hazy/streaky atelectasis or scarring at the bases. Electronically Signed   By: Donavan Foil M.D.   On: 10/01/2019 17:01   CT HEAD WO CONTRAST  Result Date: 10/01/2019 CLINICAL DATA:  Weakness, possible stroke. EXAM: CT HEAD WITHOUT CONTRAST TECHNIQUE: Contiguous axial images were obtained from the base of the skull through the vertex without intravenous contrast.  COMPARISON:  None. FINDINGS: Brain: Large right frontal low density is noted most consistent with acute infarction. No midline shift is noted. Ventricular size is within normal limits. No definite hemorrhage is noted. Vascular: No hyperdense vessel or unexpected calcification. Skull: Normal. Negative for fracture or focal lesion. Sinuses/Orbits: No acute finding. Other: None. IMPRESSION: Large right frontal low density is noted most consistent with acute infarction. No definite hemorrhage is noted. MRI is recommended for further evaluation. Electronically Signed   By: Marijo Conception M.D.   On: 10/01/2019 13:47   CT ANGIO NECK W OR WO CONTRAST  Addendum Date: 10/01/2019   ADDENDUM REPORT: 10/01/2019 18:55 ADDENDUM: Aortic Atherosclerosis (ICD10-I70.0) and Emphysema (ICD10-J43.9). 5 mm pulmonary nodule right upper lobe. Non-contrast chest CT can be considered in 12 months if patient is high-risk, which appears to be the case based on the emphysema. This recommendation follows the consensus statement: Guidelines for Management of Incidental Pulmonary Nodules Detected on CT Images: From the Fleischner Society 2017; Radiology 2017; 284:228-243. Electronically Signed   By: Nelson Chimes M.D.   On: 10/01/2019 18:55   Result Date: 10/01/2019 CLINICAL DATA:  Weakness and facial droop. Difficulty swallowing. Acute infarction right frontal lobe. Abnormal flow right internal carotid artery. EXAM: CT ANGIOGRAPHY HEAD AND NECK TECHNIQUE: Multidetector CT imaging of the head and neck was performed using the standard protocol during bolus administration of intravenous contrast. Multiplanar CT image reconstructions and MIPs were obtained to evaluate the vascular anatomy. Carotid stenosis measurements (when applicable) are obtained utilizing NASCET criteria, using the distal internal carotid diameter as the denominator. CONTRAST:  69m OMNIPAQUE IOHEXOL 350 MG/ML SOLN COMPARISON:  CT and MRI same day FINDINGS: Aortic  atherosclerosis. Branching pattern is normal without origin stenosis. CTA NECK FINDINGS Aortic arch: Aortic atherosclerosis. Branching pattern is normal without origin stenosis. Right carotid system: Common carotid artery widely patent to the bifurcation. Occlusion of the ICA in the bulb. No reconstituted flow seen in the cervical region. Left carotid system: Common carotid artery widely patent to the bifurcation. Calcified and soft plaque at the carotid bifurcation and ICA bulb but no stenosis compared to the more distal cervical ICA diameter. Vertebral arteries: Both vertebral artery origins are widely patent. The vertebral arteries are patent through the cervical region to the foramen magnum without stenosis greater than 30%. Skeleton: Ordinary cervical spondylosis and facet arthropathy. Other neck: No mass or lymphadenopathy. Upper chest: Emphysema and pulmonary scarring. 5 mm pulmonary nodule in the right upper lobe. Review of the  MIP images confirms the above findings CTA HEAD FINDINGS Anterior circulation: Right internal carotid artery does not show any flow through the skull base. Reconstituted in flow at the carotid terminus primarily on the basis of a patent anterior communicating and posterior communicating arteries. Left internal carotid artery is patent through the siphon region without stenosis greater than 30%. No large or medium vessel occlusion seen on the left. On the right, there is a missing M2 to M3 branch serving the frontal operculum. Posterior circulation: Both vertebral arteries widely patent through the foramen magnum. Right vertebral terminates in PICA. Left vertebral artery supplies the basilar. No basilar stenosis. Posterior circulation branch vessels are patent. Distal branch vessels do show atherosclerotic irregularity. Venous sinuses: Patent and normal. Anatomic variants: None significant. Review of the MIP images confirms the above findings IMPRESSION: Right internal carotid artery  occlusion in the ICA bulb. Flow to the anterior circulation on the right occurs by patent anterior and posterior communicating arteries. I do not see significant external to internal collateral flow. Apparent missing M2 to M3 branch serving the right frontal operculum infarction region. Electronically Signed: By: Nelson Chimes M.D. On: 10/01/2019 18:46   MR BRAIN WO CONTRAST  Result Date: 10/01/2019 CLINICAL DATA:  Hypertension and diabetes. Difficulty swallowing. Weakness. Abnormal head CT right frontal region. EXAM: MRI HEAD WITHOUT CONTRAST TECHNIQUE: Multiplanar, multiecho pulse sequences of the brain and surrounding structures were obtained without intravenous contrast. COMPARISON:  Head CT same day FINDINGS: Brain: There is acute infarction in the right insula and frontal operculum with the region measuring approximately 6.6 x 6.1 x 4.0 cm. Swelling of the region of infarction but without hemorrhage. Punctate acute infarction in the medial right hypothalamus. No focal abnormality affects the brainstem or cerebellum. Cerebral hemispheres otherwise show moderate chronic small-vessel changes of the deep and subcortical white matter. There is an old cortical and subcortical infarction in the right occipital lobe. No midline shift. No hydrocephalus. No extra-axial collection. Vascular: Abnormal flow pattern in the right internal carotid artery. There could be right internal carotid artery occlusion or very slow flow. Skull and upper cervical spine: Negative Sinuses/Orbits: Mild seasonal mucosal inflammatory changes of the sinuses. Orbits negative. Other: None IMPRESSION: 6.6 x 6.1 x 4.0 cm region of acute infarction affecting the right insula and right frontal operculum consistent with right MCA branch vessel infarction. Slow flow or no flow in the right ICA at the skull base. The infarction shows mild swelling but there is no mass effect or shift. No sign of blood products within the infarction. Punctate acute  infarction also demonstrated in the medial right hypothalamus. Background pattern of moderate chronic small-vessel ischemic changes affecting the cerebral hemispheric white matter. Old right occipital cortical and subcortical infarction. Electronically Signed   By: Nelson Chimes M.D.   On: 10/01/2019 17:42   ECHOCARDIOGRAM COMPLETE  Result Date: 10/02/2019    ECHOCARDIOGRAM REPORT   Patient Name:   CEDRIK HEINDL Date of Exam: 10/02/2019 Medical Rec #:  [id number]       Height:       70.0 in Accession #:    [id number]      Weight:       210.0 lb Date of Birth:  03/04/1956       BSA:          2.131 m Patient Age:    59 years        BP:           136/54 mmHg Patient  Gender: M               HR:           65 bpm. Exam Location:  Inpatient Procedure: 2D Echo Indications:    TIA 435.9 / G45.9  History:        Patient has no prior history of Echocardiogram examinations. No                 prior cardiac history.  Sonographer:    Vikki Ports Turrentine Referring Phys: [id number] Lequita Halt  Sonographer Comments: Poor patient compliance. IMPRESSIONS  1. Left ventricular ejection fraction, by estimation, is 60 to 65%. The left ventricle has normal function. The left ventricle has no regional wall motion abnormalities. There is mild concentric left ventricular hypertrophy. Left ventricular diastolic parameters are consistent with Grade I diastolic dysfunction (impaired relaxation).  2. Right ventricular systolic function is normal. The right ventricular size is normal. There is normal pulmonary artery systolic pressure.  3. The mitral valve is normal in structure. Trivial mitral valve regurgitation. No evidence of mitral stenosis.  4. The aortic valve is normal in structure. Aortic valve regurgitation is mild. No aortic stenosis is present.  5. The inferior vena cava is normal in size with greater than 50% respiratory variability, suggesting right atrial pressure of 3 mmHg. FINDINGS  Left Ventricle: Left ventricular ejection  fraction, by estimation, is 60 to 65%. The left ventricle has normal function. The left ventricle has no regional wall motion abnormalities. The left ventricular internal cavity size was normal in size. There is  mild concentric left ventricular hypertrophy. Left ventricular diastolic parameters are consistent with Grade I diastolic dysfunction (impaired relaxation). Normal left ventricular filling pressure. Right Ventricle: The right ventricular size is normal. No increase in right ventricular wall thickness. Right ventricular systolic function is normal. There is normal pulmonary artery systolic pressure. The tricuspid regurgitant velocity is 2.09 m/s, and  with an assumed right atrial pressure of 8 mmHg, the estimated right ventricular systolic pressure is 16.0 mmHg. Left Atrium: Left atrial size was normal in size. Right Atrium: Right atrial size was normal in size. Pericardium: There is no evidence of pericardial effusion. Mitral Valve: The mitral valve is normal in structure. Normal mobility of the mitral valve leaflets. Trivial mitral valve regurgitation. No evidence of mitral valve stenosis. Tricuspid Valve: The tricuspid valve is normal in structure. Tricuspid valve regurgitation is mild . No evidence of tricuspid stenosis. Aortic Valve: The aortic valve is normal in structure. Aortic valve regurgitation is mild. No aortic stenosis is present. Aortic valve mean gradient measures 5.0 mmHg. Aortic valve peak gradient measures 7.7 mmHg. Aortic valve area, by VTI measures 2.37 cm. Pulmonic Valve: The pulmonic valve was normal in structure. Pulmonic valve regurgitation is trivial. No evidence of pulmonic stenosis. Aorta: The aortic root is normal in size and structure. Venous: The inferior vena cava is normal in size with greater than 50% respiratory variability, suggesting right atrial pressure of 3 mmHg. IAS/Shunts: No atrial level shunt detected by color flow Doppler.  LEFT VENTRICLE PLAX 2D LVIDd:          4.60 cm  Diastology LVIDs:         3.09 cm  LV e' lateral:   9.68 cm/s LV PW:         1.11 cm  LV E/e' lateral: 7.4 LV IVS:        1.11 cm  LV e' medial:    7.72 cm/s LVOT  diam:     2.00 cm  LV E/e' medial:  9.3 LV SV:         78 LV SV Index:   36 LVOT Area:     3.14 cm  RIGHT VENTRICLE RV S prime:     11.30 cm/s TAPSE (M-mode): 2.1 cm LEFT ATRIUM             Index       RIGHT ATRIUM           Index LA diam:        3.40 cm 1.60 cm/m  RA Area:     16.30 cm LA Vol (A2C):   36.4 ml 17.08 ml/m RA Volume:   43.70 ml  20.51 ml/m LA Vol (A4C):   40.3 ml 18.91 ml/m LA Biplane Vol: 41.1 ml 19.29 ml/m  AORTIC VALVE AV Area (Vmax):    2.62 cm AV Area (Vmean):   2.37 cm AV Area (VTI):     2.37 cm AV Vmax:           139.00 cm/s AV Vmean:          105.000 cm/s AV VTI:            0.327 m AV Peak Grad:      7.7 mmHg AV Mean Grad:      5.0 mmHg LVOT Vmax:         116.00 cm/s LVOT Vmean:        79.100 cm/s LVOT VTI:          0.247 m LVOT/AV VTI ratio: 0.76  AORTA Ao Root diam: 3.50 cm MITRAL VALVE                TRICUSPID VALVE MV Area (PHT): 2.76 cm     TR Peak grad:   17.5 mmHg MV Decel Time: 275 msec     TR Vmax:        209.00 cm/s MV E velocity: 72.00 cm/s MV A velocity: 111.00 cm/s  SHUNTS MV E/A ratio:  0.65         Systemic VTI:  0.25 m                             Systemic Diam: 2.00 cm Fransico Him MD Electronically signed by Fransico Him MD Signature Date/Time: 10/02/2019/9:52:20 AM    Final     Scheduled Meds: . aspirin  81 mg Oral Daily  . atorvastatin  40 mg Oral Daily  . enoxaparin (LOVENOX) injection  40 mg Subcutaneous Q24H  . insulin aspart  0-9 Units Subcutaneous TID WC    Continuous Infusions: . sodium chloride 100 mL/hr at 10/01/19 2256     Time spent: 11mns, case discussed with neurology I have personally reviewed and interpreted on  10/02/2019 daily labs, tele strips, imagings as discussed above under date review session and assessment and plans.  I reviewed all nursing notes,  pharmacy notes, consultant notes,  vitals, pertinent old records  I have discussed plan of care as described above with RN , patient  on 10/02/2019   FFlorencia ReasonsMD, PhD, FACP  [facility]  Available via Epic secure chat 7am-7pm for nonurgent issues Please page for urgent issues, pager number available through [email or web address] .   10/02/2019, 12:52 PM  LOS: 1 day

## 2019-10-02 NOTE — Evaluation (Signed)
Physical Therapy Evaluation Patient Details Name: David Hendrix MRN: [id number] DOB: November 16, 1955 Today's Date: 10/02/2019   History of Present Illness  [name] is a 64 y.o. male with history of untreated HTN and DM2,  presenting with left side weakness, slurred speech, drooling and unable to eat/drink x 3 days. He did not receive IV t-PA due to out side of time window. MRI shows 6.6 x 6.1 x 4.0 cm region of acute infarction affecting the right insula and right frontal operculum consistent with right MCA branch vessel infarction.  Clinical Impression  Pt admitted with above diagnosis. Pt's strength on L side testing at 5/5 but did require increased time and with decreased coordination.  Pt demonstrated L inattention, decreased balance, and decreased safety awareness.  Pt does appear to have decreased insight into deficits and safety.  He was able to ambulate in hall but with poor safety and mod-max cues for direction and safety. DGI score of 13 indicates high fall risk. Pt unsafe to be home alone at d/c.  Pt currently with functional limitations due to the deficits listed below (see PT Problem List). Pt will benefit from skilled PT to increase their independence and safety with mobility to allow discharge to the venue listed below.       Follow Up Recommendations CIR    Equipment Recommendations  None recommended by PT    Recommendations for Other Services Rehab consult     Precautions / Restrictions Precautions Precautions: Fall Restrictions Weight Bearing Restrictions: No      Mobility  Bed Mobility Overal bed mobility: Needs Assistance Bed Mobility: Supine to Sit;Sit to Supine     Supine to sit: Supervision Sit to supine: Supervision   General bed mobility comments: for safety  Transfers Overall transfer level: Needs assistance Equipment used: None Transfers: Sit to/from Stand;Stand Pivot Transfers Sit to Stand: Min guard Stand pivot transfers: Min guard        General transfer comment: for safety  Ambulation/Gait Ambulation/Gait assistance: Min guard Gait Distance (Feet): 200 Feet Assistive device: None Gait Pattern/deviations: Decreased stride length;Drifts right/left Gait velocity: decreased   General Gait Details: Decreased gait speed and tendency to drift left.  Had difficulty finding objects on L when walking.  He was able to identify large objects on L (people, beds, etc) and avoid narrowly with cues.  Pt reports he feels his gait is near normal ; however, pt appears to have poor insight into deficits.  Suspect pt had more normal gait speed and quality at baseline.   Stairs            Wheelchair Mobility    Modified Rankin (Stroke Patients Only) Modified Rankin (Stroke Patients Only) Pre-Morbid Rankin Score: No symptoms Modified Rankin: Moderately severe disability     Balance Overall balance assessment: Needs assistance   Sitting balance-Leahy Scale: Normal     Standing balance support: No upper extremity supported;During functional activity Standing balance-Leahy Scale: Fair                   Standardized Balance Assessment Standardized Balance Assessment : Dynamic Gait Index   Dynamic Gait Index Level Surface: Mild Impairment Change in Gait Speed: Mild Impairment Gait with Horizontal Head Turns: Mild Impairment Gait with Vertical Head Turns: Mild Impairment Gait and Pivot Turn: Mild Impairment Step Over Obstacle: Moderate Impairment Step Around Obstacles: Moderate Impairment Steps: Moderate Impairment Total Score: 13       Pertinent Vitals/Pain Pain Assessment: No/denies pain    Home  Living Family/patient expects to be discharged to:: Private residence Living Arrangements: Alone Available Help at Discharge: Other (Comment)(limited assistance) Type of Home: House (lives in same property as business, has a room and bathroom but no shower) Home Access: Level entry     Home Layout: One  [place]: None Additional Comments: son and daughter in law-- pt reports that he has a strained relationship with son so he is really on his own    Prior Function Level of Independence: Independent   Gait / Transfers Assistance Needed: pt reports that he lives on the same property as the business and Thurmond Butts ( son ) lives 5 minutes from that property  ADL's / Homemaking Assistance Needed: outdoor shower setup by standing holding water hose overhead  Comments: Independent with all adls and IADLs.  Normally works everyday on car transmissions.     Hand Dominance   Dominant Hand: Right    Extremity/Trunk Assessment   Upper Extremity Assessment Upper Extremity Assessment: Overall WFL for tasks assessed(Grossly screened will defer to OT for further assessment)    Lower Extremity Assessment Lower Extremity Assessment: LLE deficits/detail;RLE deficits/detail RLE Deficits / Details: ROM WFL and MMT 5/5 LLE Deficits / Details: ROM WFL; MMT 5/5 but did required increased time to initiate movements LLE Sensation: WNL LLE Coordination: decreased gross motor    Cervical / Trunk Assessment Cervical / Trunk Assessment: Normal  Communication   Communication: No difficulties  Cognition Arousal/Alertness: Awake/alert Behavior During Therapy: Flat affect Overall Cognitive Status: No family/caregiver present to determine baseline cognitive functioning Area of Impairment: Awareness;Safety/judgement;Following commands;Memory;Problem solving                     Memory: Decreased short-term memory Following Commands: Follows multi-step commands with increased time Safety/Judgement: Decreased awareness of deficits   Problem Solving: Slow processing;Difficulty sequencing;Requires verbal cues General Comments: Oriented to season and year, self, location. Does demonstrate L inattention and required increased time to respond to multistep commands      General Comments General  comments (skin integrity, edema, etc.): Pt tending to look R with head turned and eyes - can look L with cues.  He was able to read clock on L but required max cues to keep looking L to find clock.  EOEM was intact.    Exercises     Assessment/Plan    PT Assessment Patient needs continued PT services  PT Problem List Decreased mobility;Decreased safety awareness;Decreased coordination;Decreased knowledge of precautions;Decreased activity tolerance;Decreased balance;Decreased knowledge of use of DME       PT Treatment Interventions DME instruction;Therapeutic activities;Cognitive remediation;Gait training;Therapeutic exercise;Patient/family education;Stair training;Balance training;Functional mobility training;Neuromuscular re-education    PT Goals (Current goals can be found in the Care Plan section)  Acute Rehab PT Goals Patient Stated Goal: go home; take a shower PT Goal Formulation: With patient Time For Goal Achievement: 10/17/19 Potential to Achieve Goals: Good Additional Goals Additional Goal #1: Pt will increase DGI score to >19 for decreased fall risk    Frequency Min 4X/week   Barriers to discharge Decreased caregiver support      Co-evaluation               AM-PAC PT "6 Clicks" Mobility  Outcome Measure Help needed turning from your back to your side while in a flat bed without using bedrails?: None Help needed moving from lying on your back to sitting on the side of a flat bed without using bedrails?: None Help needed moving to and  from a bed to a chair (including a wheelchair)?: None Help needed standing up from a chair using your arms (e.g., wheelchair or bedside chair)?: None Help needed to walk in hospital room?: A Little Help needed climbing 3-5 steps with a railing? : A Little 6 Click Score: 22    End of Session Equipment Utilized During Treatment: Gait belt Activity Tolerance: Patient tolerated treatment well Patient left: in bed;with call  bell/phone within reach;with bed alarm set Nurse Communication: Mobility status PT Visit Diagnosis: Unsteadiness on feet (R26.81)    Time: 1610-9604 PT Time Calculation (min) (ACUTE ONLY): 30 min   Charges:   PT Evaluation $PT Eval Moderate Complexity: 1 Mod PT Treatments $Gait Training: 8-22 mins        Royetta Asal, PT Acute Rehab Services Pager [phone] [facility] [phone] [facility] [phone]   Rayetta Humphrey 10/02/2019, 1:07 PM

## 2019-10-02 NOTE — Evaluation (Signed)
Occupational Therapy Evaluation Patient Details Name: David Hendrix MRN: [id number] DOB: 1955-08-27 Today's Date: 10/02/2019    History of Present Illness [name] is a 64 y.o. male with history of untreated HTN and DM2,  presenting with left side weakness, slurred speech, drooling and unable to eat/drink x 3 days. He did not receive IV t-PA due to out side of time window. MRI shows 6.6 x 6.1 x 4.0 cm region of acute infarction affecting the right insula and right frontal operculum consistent with right MCA branch vessel infarction.   Clinical Impression   PT admitted with R MCA. Pt currently with functional limitiations due to the deficits listed below (see OT problem list). Pt currently with fall risk and decreased cognition for adls. Pt requires safety cues throughout session. Pt with decr ability to utilize cell phone. 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 Pt loves racing and talking about grandson's racing.  Pt will benefit from skilled OT to increase their independence and safety with adls and balance to allow discharge CIR.     Follow Up Recommendations  CIR    Equipment Recommendations  None recommended by OT    Recommendations for Other Services Rehab consult     Precautions / Restrictions Precautions Precautions: Fall Restrictions Weight Bearing Restrictions: No      Mobility Bed Mobility Overal bed mobility: Needs Assistance Bed Mobility: Supine to Sit     Supine to sit: Supervision Sit to supine: Supervision   General bed mobility comments: cues for safety as pt is exiting without awareness to IV line or condom foley  Transfers Overall transfer level: Needs assistance Equipment used: None Transfers: Sit to/from Stand Sit to Stand: Min guard Stand pivot transfers: Min guard       General transfer comment: pt requires cues for safety and line management. pt requires instruction to  stop at one point for safety and pt very distracted by internal need to void    Balance Overall balance assessment: Needs assistance   Sitting balance-Leahy Scale: Normal     Standing balance support: During functional activity Standing balance-Leahy Scale: Fair Standing balance comment: static standing min guard during session. due to impulsive during session did not get to single leg challenges yet                 Standardized Balance Assessment Standardized Balance Assessment : Dynamic Gait Index   Dynamic Gait Index Level Surface: Mild Impairment Change in Gait Speed: Mild Impairment Gait with Horizontal Head Turns: Mild Impairment Gait with Vertical Head Turns: Mild Impairment Gait and Pivot Turn: Mild Impairment Step Over Obstacle: Moderate Impairment Step Around Obstacles: Moderate Impairment Steps: Moderate Impairment Total Score: 13     ADL either performed or assessed with clinical judgement   ADL Overall ADL's : Needs assistance/impaired Eating/Feeding: NPO Eating/Feeding Details (indicate cue type and reason): pending angiogram - pt with no recall of several educations of this pending test. pt continues to request food Grooming: Wash/dry hands;Minimal assistance;Standing Grooming Details (indicate cue type and reason): cues for sequence     Lower Body Bathing: Moderate assistance;Sit to/from stand Lower Body Bathing Details (indicate cue type and reason): peri care after voiding urine and needing to perform hygiene on legs         Toilet Transfer: Minimal assistance Toilet Transfer Details (indicate cue type and reason): standing at commode to void. pt pulling condom cath completely off to  void Toileting- Architect and Hygiene: Moderate assistance       Functional mobility during ADLs: Minimal assistance General ADL Comments: pt positioned in dry chair with phone and blanket. pt states "i am cold natured" pt appears pleased up in chair.  pt making comments several times during session that were inappropriate for interaction between therapist and patient. pt redirected each time to task and cued to not appropriate.      Vision Baseline Vision/History: Wears glasses Wears Glasses: Reading only Vision Assessment?: Yes Additional Comments: notedto have horizontal beating nystagmus with eye movement to the L quadrant     Perception Perception Perception Tested?: Yes Perception Deficits: Inattention/neglect Inattention/Neglect: Does not attend to left visual field Comments: pt requires cues to locate soap and hand towel on L side of sink   Praxis      Pertinent Vitals/Pain Pain Assessment: No/denies pain     Hand Dominance Right   Extremity/Trunk Assessment Upper Extremity Assessment Upper Extremity Assessment: Overall WFL for tasks assessed   Lower Extremity Assessment Lower Extremity Assessment: LLE deficits/detail;RLE deficits/detail RLE Deficits / Details: ROM WFL and MMT 5/5 LLE Deficits / Details: ROM WFL; MMT 5/5 but did required increased time to initiate movements LLE Sensation: WNL LLE Coordination: decreased gross motor   Cervical / Trunk Assessment Cervical / Trunk Assessment: Normal   Communication Communication Communication: No difficulties   Cognition Arousal/Alertness: Awake/alert Behavior During Therapy: Flat affect;Impulsive Overall Cognitive Status: No family/caregiver present to determine baseline cognitive functioning Area of Impairment: Memory;Following commands;Safety/judgement;Awareness;Problem solving                     Memory: Decreased short-term memory;Decreased recall of precautions Following Commands: Follows multi-step commands inconsistently Safety/Judgement: Decreased awareness of safety;Decreased awareness of deficits Awareness: Emergent Problem Solving: Slow processing;Difficulty sequencing General Comments: pt unable to unlock cell phone but able to state  "code [id number]" > Pt speaking of grandson and attempting to show photo. pt with extended time navigates to facebook but started to type message with what he was looking for instead of locating photo. Pt with incontinence of urine due to condom cath d/ced and no awareness. pt asked if his gown was wet adn responds yes. Pt fixated on taking a shower even after education of IV and condom cath. Pt peeing on the floor of bathroom and walking through it with statement "thats wet right here"    General Comments  pt lacks awareness to deficits with high fall risk.     Exercises     Shoulder Instructions      Home Living Family/patient expects to be discharged to:: Private residence Living Arrangements: Alone   Type of Home: House Home Access: Level entry     Home Layout: One level         Firefighter: Standard     Home Equipment: None   Additional Comments: son and daughter in law-- pt reports that he has a strained relationship with son so he is really on his own      Prior Functioning/Environment Level of Independence: Independent  Gait / Transfers Assistance Needed: pt reports that he lives on the same property as the business and Alycia Rossetti ( son ) lives 5 minutes from that property ADL's / Homemaking Assistance Needed: outdoor shower setup by standing holding water hose overhead   Comments: Independent with all adls and IADLs.  Normally works everyday on car transmissions.        OT Problem List: Decreased strength;Decreased  activity tolerance;Impaired balance (sitting and/or standing);Decreased safety awareness;Decreased cognition;Decreased knowledge of precautions      OT Treatment/Interventions: Self-care/ADL training;Therapeutic exercise;Neuromuscular education;Energy conservation;DME and/or AE instruction;Manual therapy;Therapeutic activities;Cognitive remediation/compensation;Patient/family education;Visual/perceptual remediation/compensation;Balance training    OT  Goals(Current goals can be found in the care plan section) Acute Rehab OT Goals Patient Stated Goal: go home; take a shower OT Goal Formulation: With patient Time For Goal Achievement: 10/16/19 Potential to Achieve Goals: Good  OT Frequency: Min 2X/week   Barriers to D/C:            Co-evaluation              AM-PAC OT "6 Clicks" Daily Activity     Outcome Measure Help from another person eating meals?: A Little Help from another person taking care of personal grooming?: A Little Help from another person toileting, which includes using toliet, bedpan, or urinal?: A Little Help from another person bathing (including washing, rinsing, drying)?: A Little Help from another person to put on and taking off regular upper body clothing?: A Little Help from another person to put on and taking off regular lower body clothing?: A Little 6 Click Score: 18   End of Session Nurse Communication: Mobility status;Precautions(Rn made aware in chair )  Activity Tolerance: Patient tolerated treatment well Patient left: in chair;with call bell/phone within reach;with chair alarm set  OT Visit Diagnosis: Unsteadiness on feet (R26.81);Muscle weakness (generalized) (M62.81)                Time: 7096-2836 OT Time Calculation (min): 28 min Charges:  OT General Charges $OT Visit: 1 Visit OT Evaluation $OT Eval Moderate Complexity: 1 Mod OT Treatments $Self Care/Home Management : 8-22 mins   Brynn, OTR/L  Acute Rehabilitation Services Pager: [phone] Office: [phone] .   Mateo Flow 10/02/2019, 2:49 PM

## 2019-10-02 NOTE — Progress Notes (Addendum)
STROKE TEAM PROGRESS NOTE   INTERVAL HISTORY SLP is at the bedside. He is cleared for PO with precautions. He is tearful when discussing MRI and CTA findings.  I have personally reviewed history of presenting illness with the patient, electronic medical records and imaging films in PACS.  He states categorically that he was fine on the weekend and yesterday at 1:00 somebody found him to have facial droop.  He denied any dysarthria extremity weakness gait balance problems or headaches.  He denies any prior history of strokes.  MRI scan of the brain shows embolic-looking right MCA branch infarct with remote age right corona radiata infarcts as well.  CT angiogram showing right carotid occlusion in the neck. LDL cholesterol is elevated at 132 mg percent and hemoglobin A1c is 7.6.  He also has longstanding history of smoking.  He denies history of sleep apnea. He states his left-sided weakness is a lot improved today. Vitals:   10/02/19 0017 10/02/19 0420 10/02/19 0840 10/02/19 1144  BP: (!) 155/103 (!) 136/54 119/82 (!) 148/75  Pulse: 70 65 73 72  Resp: 18 18 18 18   Temp: 99.2 F (37.3 C) 98.2 F (36.8 C) 98.2 F (36.8 C) 99 F (37.2 C)  TempSrc: Oral Oral  Oral  SpO2: 97% 97% 98% 98%  Weight:      Height:        CBC:  Recent Labs  Lab 10/01/19 1227  WBC 11.4*  NEUTROABS 7.8*  HGB 16.6  HCT 48.6  MCV 93.1  PLT 269    Basic Metabolic Panel:  Recent Labs  Lab 10/01/19 1227  NA 135  K 4.0  CL 103  CO2 23  GLUCOSE 245*  BUN 12  CREATININE 0.76  CALCIUM 9.1   Lipid Panel:     Component Value Date/Time   CHOL 199 10/02/2019 0428   TRIG 226 (H) 10/02/2019 0428   HDL 22 (L) 10/02/2019 0428   CHOLHDL 9.0 10/02/2019 0428   VLDL 45 (H) 10/02/2019 0428   LDLCALC 132 (H) 10/02/2019 0428   HgbA1c:  Lab Results  Component Value Date   HGBA1C 7.6 (H) 10/02/2019   Urine Drug Screen: No results found for: LABOPIA, COCAINSCRNUR, LABBENZ, AMPHETMU, THCU, LABBARB  Alcohol Level  [facility] results found for: [facility]  IMAGING past 24 hours CT ANGIO HEAD W OR WO CONTRAST  Addendum Date: 10/01/2019   ADDENDUM REPORT: 10/01/2019 18:55 ADDENDUM: Aortic Atherosclerosis (ICD10-I70.0) and Emphysema (ICD10-J43.9). 5 mm pulmonary nodule right upper lobe. Non-contrast chest CT can be considered in 12 months if patient is high-risk, which appears to be the case based on the emphysema. This recommendation follows the consensus statement: Guidelines for Management of Incidental Pulmonary Nodules Detected on CT Images: From the Fleischner Society 2017; Radiology 2017; [id number]:228-243. Electronically Signed   By: Paulina FusiMark  Shogry M.D.   On: 10/01/2019 18:55   Result Date: 10/01/2019 CLINICAL DATA:  Weakness and facial droop. Difficulty swallowing. Acute infarction right frontal lobe. Abnormal flow right internal carotid artery. EXAM: CT ANGIOGRAPHY HEAD AND NECK TECHNIQUE: Multidetector CT imaging of the head and neck was performed using the standard protocol during bolus administration of intravenous contrast. Multiplanar CT image reconstructions and MIPs were obtained to evaluate the vascular anatomy. Carotid stenosis measurements (when applicable) are obtained utilizing NASCET criteria, using the distal internal carotid diameter as the denominator. CONTRAST:  75mL OMNIPAQUE IOHEXOL 350 MG/ML SOLN COMPARISON:  CT and MRI same day FINDINGS: Aortic atherosclerosis. Branching pattern is normal without origin stenosis. CTA NECK  FINDINGS Aortic arch: Aortic atherosclerosis. Branching pattern is normal without origin stenosis. Right carotid system: Common carotid artery widely patent to the bifurcation. Occlusion of the ICA in the bulb. No reconstituted flow seen in the cervical region. Left carotid system: Common carotid artery widely patent to the bifurcation. Calcified and soft plaque at the carotid bifurcation and ICA bulb but no stenosis compared to the more distal cervical ICA diameter. Vertebral arteries: Both  vertebral artery origins are widely patent. The vertebral arteries are patent through the cervical region to the foramen magnum without stenosis greater than 30%. Skeleton: Ordinary cervical spondylosis and facet arthropathy. Other neck: No mass or lymphadenopathy. Upper chest: Emphysema and pulmonary scarring. 5 mm pulmonary nodule in the right upper lobe. Review of the MIP images confirms the above findings CTA HEAD FINDINGS Anterior circulation: Right internal carotid artery does not show any flow through the skull base. Reconstituted in flow at the carotid terminus primarily on the basis of a patent anterior communicating and posterior communicating arteries. Left internal carotid artery is patent through the siphon region without stenosis greater than 30%. No large or medium vessel occlusion seen on the left. On the right, there is a missing M2 to M3 branch serving the frontal operculum. Posterior circulation: Both vertebral arteries widely patent through the foramen magnum. Right vertebral terminates in PICA. Left vertebral artery supplies the basilar. No basilar stenosis. Posterior circulation branch vessels are patent. Distal branch vessels do show atherosclerotic irregularity. Venous sinuses: Patent and normal. Anatomic variants: None significant. Review of the MIP images confirms the above findings IMPRESSION: Right internal carotid artery occlusion in the ICA bulb. Flow to the anterior circulation on the right occurs by patent anterior and posterior communicating arteries. I do not see significant external to internal collateral flow. Apparent missing M2 to M3 branch serving the right frontal operculum infarction region. Electronically Signed: By: Paulina Fusi M.D. On: 10/01/2019 18:46   DG Chest 2 View  Result Date: 10/01/2019 CLINICAL DATA:  Hypertension CVA EXAM: CHEST - 2 VIEW COMPARISON:  None. FINDINGS: No focal airspace disease or pleural effusion. Borderline cardiomegaly. Hazy/streaky  atelectasis or scarring at the bases. No pneumothorax. IMPRESSION: Hazy/streaky atelectasis or scarring at the bases. Electronically Signed   By: Jasmine Pang M.D.   On: 10/01/2019 17:01   CT HEAD WO CONTRAST  Result Date: 10/01/2019 CLINICAL DATA:  Weakness, possible stroke. EXAM: CT HEAD WITHOUT CONTRAST TECHNIQUE: Contiguous axial images were obtained from the base of the skull through the vertex without intravenous contrast. COMPARISON:  None. FINDINGS: Brain: Large right frontal low density is noted most consistent with acute infarction. No midline shift is noted. Ventricular size is within normal limits. No definite hemorrhage is noted. Vascular: No hyperdense vessel or unexpected calcification. Skull: Normal. Negative for fracture or focal lesion. Sinuses/Orbits: No acute finding. Other: None. IMPRESSION: Large right frontal low density is noted most consistent with acute infarction. No definite hemorrhage is noted. MRI is recommended for further evaluation. Electronically Signed   By: Lupita Raider M.D.   On: 10/01/2019 13:47   CT ANGIO NECK W OR WO CONTRAST  Addendum Date: 10/01/2019   ADDENDUM REPORT: 10/01/2019 18:55 ADDENDUM: Aortic Atherosclerosis (ICD10-I70.0) and Emphysema (ICD10-J43.9). 5 mm pulmonary nodule right upper lobe. Non-contrast chest CT can be considered in 12 months if patient is high-risk, which appears to be the case based on the emphysema. This recommendation follows the consensus statement: Guidelines for Management of Incidental Pulmonary Nodules Detected on CT Images: From  the Fleischner Society 2017; Radiology 2017; [phone]. Electronically Signed   By: Paulina Fusi M.D.   On: 10/01/2019 18:55   Result Date: 10/01/2019 CLINICAL DATA:  Weakness and facial droop. Difficulty swallowing. Acute infarction right frontal lobe. Abnormal flow right internal carotid artery. EXAM: CT ANGIOGRAPHY HEAD AND NECK TECHNIQUE: Multidetector CT imaging of the head and neck was  performed using the standard protocol during bolus administration of intravenous contrast. Multiplanar CT image reconstructions and MIPs were obtained to evaluate the vascular anatomy. Carotid stenosis measurements (when applicable) are obtained utilizing NASCET criteria, using the distal internal carotid diameter as the denominator. CONTRAST:  30mL OMNIPAQUE IOHEXOL 350 MG/ML SOLN COMPARISON:  CT and MRI same day FINDINGS: Aortic atherosclerosis. Branching pattern is normal without origin stenosis. CTA NECK FINDINGS Aortic arch: Aortic atherosclerosis. Branching pattern is normal without origin stenosis. Right carotid system: Common carotid artery widely patent to the bifurcation. Occlusion of the ICA in the bulb. No reconstituted flow seen in the cervical region. Left carotid system: Common carotid artery widely patent to the bifurcation. Calcified and soft plaque at the carotid bifurcation and ICA bulb but no stenosis compared to the more distal cervical ICA diameter. Vertebral arteries: Both vertebral artery origins are widely patent. The vertebral arteries are patent through the cervical region to the foramen magnum without stenosis greater than 30%. Skeleton: Ordinary cervical spondylosis and facet arthropathy. Other neck: No mass or lymphadenopathy. Upper chest: Emphysema and pulmonary scarring. 5 mm pulmonary nodule in the right upper lobe. Review of the MIP images confirms the above findings CTA HEAD FINDINGS Anterior circulation: Right internal carotid artery does not show any flow through the skull base. Reconstituted in flow at the carotid terminus primarily on the basis of a patent anterior communicating and posterior communicating arteries. Left internal carotid artery is patent through the siphon region without stenosis greater than 30%. No large or medium vessel occlusion seen on the left. On the right, there is a missing M2 to M3 branch serving the frontal operculum. Posterior circulation: Both  vertebral arteries widely patent through the foramen magnum. Right vertebral terminates in PICA. Left vertebral artery supplies the basilar. No basilar stenosis. Posterior circulation branch vessels are patent. Distal branch vessels do show atherosclerotic irregularity. Venous sinuses: Patent and normal. Anatomic variants: None significant. Review of the MIP images confirms the above findings IMPRESSION: Right internal carotid artery occlusion in the ICA bulb. Flow to the anterior circulation on the right occurs by patent anterior and posterior communicating arteries. I do not see significant external to internal collateral flow. Apparent missing M2 to M3 branch serving the right frontal operculum infarction region. Electronically Signed: By: Paulina Fusi M.D. On: 10/01/2019 18:46   MR BRAIN WO CONTRAST  Result Date: 10/01/2019 CLINICAL DATA:  Hypertension and diabetes. Difficulty swallowing. Weakness. Abnormal head CT right frontal region. EXAM: MRI HEAD WITHOUT CONTRAST TECHNIQUE: Multiplanar, multiecho pulse sequences of the brain and surrounding structures were obtained without intravenous contrast. COMPARISON:  Head CT same day FINDINGS: Brain: There is acute infarction in the right insula and frontal operculum with the region measuring approximately 6.6 x 6.1 x 4.0 cm. Swelling of the region of infarction but without hemorrhage. Punctate acute infarction in the medial right hypothalamus. No focal abnormality affects the brainstem or cerebellum. Cerebral hemispheres otherwise show moderate chronic small-vessel changes of the deep and subcortical white matter. There is an old cortical and subcortical infarction in the right occipital lobe. No midline shift. No hydrocephalus. No extra-axial collection. Vascular:  Abnormal flow pattern in the right internal carotid artery. There could be right internal carotid artery occlusion or very slow flow. Skull and upper cervical spine: Negative Sinuses/Orbits: Mild  seasonal mucosal inflammatory changes of the sinuses. Orbits negative. Other: None IMPRESSION: 6.6 x 6.1 x 4.0 cm region of acute infarction affecting the right insula and right frontal operculum consistent with right MCA branch vessel infarction. Slow flow or no flow in the right ICA at the skull base. The infarction shows mild swelling but there is no mass effect or shift. No sign of blood products within the infarction. Punctate acute infarction also demonstrated in the medial right hypothalamus. Background pattern of moderate chronic small-vessel ischemic changes affecting the cerebral hemispheric white matter. Old right occipital cortical and subcortical infarction. Electronically Signed   By: Nelson Chimes M.D.   On: 10/01/2019 17:42   ECHOCARDIOGRAM COMPLETE  Result Date: 10/02/2019    ECHOCARDIOGRAM REPORT   Patient Name:   David Hendrix Date of Exam: 10/02/2019 Medical Rec #:  [id number]       Height:       70.0 in Accession #:    [id number]      Weight:       210.0 lb Date of Birth:  [birth year]       BSA:          2.131 m Patient Age:    37 years        BP:           136/54 mmHg Patient Gender: M               HR:           65 bpm. Exam Location:  Inpatient Procedure: 2D Echo Indications:    TIA 435.9 / G45.9  History:        Patient has no prior history of Echocardiogram examinations. No                 prior cardiac history.  Sonographer:    Vikki Ports Turrentine Referring Phys: [id number] Lequita Halt  Sonographer Comments: Poor patient compliance. IMPRESSIONS  1. Left ventricular ejection fraction, by estimation, is 60 to 65%. The left ventricle has normal function. The left ventricle has no regional wall motion abnormalities. There is mild concentric left ventricular hypertrophy. Left ventricular diastolic parameters are consistent with Grade I diastolic dysfunction (impaired relaxation).  2. Right ventricular systolic function is normal. The right ventricular size is normal. There is normal pulmonary  artery systolic pressure.  3. The mitral valve is normal in structure. Trivial mitral valve regurgitation. No evidence of mitral stenosis.  4. The aortic valve is normal in structure. Aortic valve regurgitation is mild. No aortic stenosis is present.  5. The inferior vena cava is normal in size with greater than 50% respiratory variability, suggesting right atrial pressure of 3 mmHg. FINDINGS  Left Ventricle: Left ventricular ejection fraction, by estimation, is 60 to 65%. The left ventricle has normal function. The left ventricle has no regional wall motion abnormalities. The left ventricular internal cavity size was normal in size. There is  mild concentric left ventricular hypertrophy. Left ventricular diastolic parameters are consistent with Grade I diastolic dysfunction (impaired relaxation). Normal left ventricular filling pressure. Right Ventricle: The right ventricular size is normal. No increase in right ventricular wall thickness. Right ventricular systolic function is normal. There is normal pulmonary artery systolic pressure. The tricuspid regurgitant velocity is 2.09 m/s, and  with an assumed right atrial pressure  of 8 mmHg, the estimated right ventricular systolic pressure is 25.5 mmHg. Left Atrium: Left atrial size was normal in size. Right Atrium: Right atrial size was normal in size. Pericardium: There is no evidence of pericardial effusion. Mitral Valve: The mitral valve is normal in structure. Normal mobility of the mitral valve leaflets. Trivial mitral valve regurgitation. No evidence of mitral valve stenosis. Tricuspid Valve: The tricuspid valve is normal in structure. Tricuspid valve regurgitation is mild . No evidence of tricuspid stenosis. Aortic Valve: The aortic valve is normal in structure. Aortic valve regurgitation is mild. No aortic stenosis is present. Aortic valve mean gradient measures 5.0 mmHg. Aortic valve peak gradient measures 7.7 mmHg. Aortic valve area, by VTI measures 2.37  cm. Pulmonic Valve: The pulmonic valve was normal in structure. Pulmonic valve regurgitation is trivial. No evidence of pulmonic stenosis. Aorta: The aortic root is normal in size and structure. Venous: The inferior vena cava is normal in size with greater than 50% respiratory variability, suggesting right atrial pressure of 3 mmHg. IAS/Shunts: No atrial level shunt detected by color flow Doppler.  LEFT VENTRICLE PLAX 2D LVIDd:         4.60 cm  Diastology LVIDs:         3.09 cm  LV e' lateral:   9.68 cm/s LV PW:         1.11 cm  LV E/e' lateral: 7.4 LV IVS:        1.11 cm  LV e' medial:    7.72 cm/s LVOT diam:     2.00 cm  LV E/e' medial:  9.3 LV SV:         78 LV SV Index:   36 LVOT Area:     3.14 cm  RIGHT VENTRICLE RV S prime:     11.30 cm/s TAPSE (M-mode): 2.1 cm LEFT ATRIUM             Index       RIGHT ATRIUM           Index LA diam:        3.40 cm 1.60 cm/m  RA Area:     16.30 cm LA Vol (A2C):   36.4 ml 17.08 ml/m RA Volume:   43.70 ml  20.51 ml/m LA Vol (A4C):   40.3 ml 18.91 ml/m LA Biplane Vol: 41.1 ml 19.29 ml/m  AORTIC VALVE AV Area (Vmax):    2.62 cm AV Area (Vmean):   2.37 cm AV Area (VTI):     2.37 cm AV Vmax:           139.00 cm/s AV Vmean:          105.000 cm/s AV VTI:            0.327 m AV Peak Grad:      7.7 mmHg AV Mean Grad:      5.0 mmHg LVOT Vmax:         116.00 cm/s LVOT Vmean:        79.100 cm/s LVOT VTI:          0.247 m LVOT/AV VTI ratio: 0.76  AORTA Ao Root diam: 3.50 cm MITRAL VALVE                TRICUSPID VALVE MV Area (PHT): 2.76 cm     TR Peak grad:   17.5 mmHg MV Decel Time: 275 msec     TR Vmax:        209.00 cm/s MV E velocity:  72.00 cm/s MV A velocity: 111.00 cm/s  SHUNTS MV E/A ratio:  0.65         Systemic VTI:  0.25 m                             Systemic Diam: 2.00 cm Armanda Magic MD Electronically signed by Armanda Magic MD Signature Date/Time: 10/02/2019/9:52:20 AM    Final     PHYSICAL EXAM GEN: Obese middle-aged Caucasian male tearful, but no acute  distress HENT: Positive fordrooling.  Respiratory: Negative forcoughand shortness of breath.  Cardiovascular: Negative forchest pain.  Gastrointestinal: Negative forabdominal pain,anorexiaand nausea.  Genitourinary: Negative fordysuria.  Musculoskeletal: Negative forfalls.  Neurological: A&Ox4, speech is dysarthric. No anomia. No aphashia, PERRL, EOMI, dense left facial droop. Tongue is not midline.shrug is weak on left. 4/5 grip on left w/drift. 4/5 foot push w/mild drift on left lrg. Rt wnl. FTN, HTS wnl Did not test gait  NIHSS 1a Level of Conscious.: 0 1b LOC Questions: 0 1c LOC Commands:0  2 Best Gaze: 0 3 Visual: 0 4 Facial Palsy: 2  5a Motor Arm - Left:0 5b Motor Arm - Right0:  6a Motor Leg - Left: 1 6b Motor Leg - Right:0  7 Limb Ataxia: 0 8 Sensory: 0 9 Best Language: 0 10 Dysarthria: 1 11 Extinct. and Inatten.: 0 TOTAL: 4  ASSESSMENT/PLAN Mr. Chue Berkovich is a 64 y.o. male with history of DM2 and HTN presenting with left side weakness, slurred speech, drooling and unable to eat/drink x 3 days. He did not receive IV t-PA due to out side of time window. No thrombectomy given 3 day time line and competed stroke.  Stroke:  Embolic appearing right frontal stroke d/t right carotid occlusion and neck  CTA head & neck  ICA occlusion-likely acute. IR consulted  MRI  Large right frontal stroke  2D Echo pending  LDL 132  HgbA1c 7.6  lovenox for VTE prophylaxis    Diet   DIET DYS 2 Room service appropriate? Yes; Fluid consistency: Thin     none prior to admission, now on ASA, will add Plavix pending vascular consult (incase surgical procedure is needed)  Therapy recommendations: pending  Disposition:  pending  Hypertension  Home meds:  none  Stable . Permissive hypertension not indicated . Long-term BP goal normotensive  Hyperlipidemia  Home meds:  none  LDL 132, goal < 70  Add  Lipitor po daily  Continue statin at  discharge  Diabetes type II Uncontrolled  Home meds:  none  HgbA1c 7.6, goal < 7.0  CBGs Recent Labs    10/02/19 0530 10/02/19 0839 10/02/19 1144  GLUCAP 145* 139* 149*      SSI  Other Stroke Risk Factors  Cigarette smoker; advised to stop smoking  ongestive heart failure  Hospital day # 1  Desiree Metzger-Cihelka, ARNP-C, ANVP-BC Pager: [phone] I have personally obtained history,examined this patient, reviewed notes, independently viewed imaging studies, participated in medical decision making and plan of care.ROS completed by me personally and pertinent positives fully documented  I have made any additions or clarifications directly to the above note. Agree with note above.  He presented with dysarthria and left facial droop secondary to embolic right MCA branch infarct from right carotid occlusion.  He remains at risk for neurological worsening and recurrent strokes.  Recommend aspirin and Plavix and aggressive risk factor modification.  Check carotid ultrasound or diagnostic cerebral catheter angiogram which is available sooner  to look for string sign versus total occlusion.  Patient counseled to quit smoking and is agreeable.  Patient may also consider possible participation in the [employer] stroke prevention trial if interested and will be given information to review and decide.  Discussed with Dr. Parke Poisson and Dr. Corliss Skains.  Greater than 50% time during this 35-minute visit was spent on counseling and coordination of care about his carotid occlusion and stroke and answering questions  Delia Heady, MD Medical Director [facility] Pager: [phone] 10/02/2019 2:32 PM  To contact Stroke Continuity provider, please refer to [email or web address]. After hours, contact General Neurology

## 2019-10-03 ENCOUNTER — Inpatient Hospital Stay (HOSPITAL_COMMUNITY): Payer: Medicare Other

## 2019-10-03 DIAGNOSIS — E1165 Type 2 diabetes mellitus with hyperglycemia: Secondary | ICD-10-CM | POA: Diagnosis not present

## 2019-10-03 DIAGNOSIS — Z91199 Patient's noncompliance with other medical treatment and regimen due to unspecified reason: Secondary | ICD-10-CM

## 2019-10-03 DIAGNOSIS — I10 Essential (primary) hypertension: Secondary | ICD-10-CM | POA: Diagnosis not present

## 2019-10-03 DIAGNOSIS — Z9119 Patient's noncompliance with other medical treatment and regimen: Secondary | ICD-10-CM

## 2019-10-03 DIAGNOSIS — I63231 Cerebral infarction due to unspecified occlusion or stenosis of right carotid arteries: Secondary | ICD-10-CM | POA: Diagnosis not present

## 2019-10-03 HISTORY — PX: IR ANGIO INTRA EXTRACRAN SEL COM CAROTID INNOMINATE BILAT MOD SED: IMG5360

## 2019-10-03 HISTORY — PX: IR ANGIO VERTEBRAL SEL VERTEBRAL UNI R MOD SED: IMG5368

## 2019-10-03 HISTORY — PX: IR ANGIO VERTEBRAL SEL SUBCLAVIAN INNOMINATE UNI L MOD SED: IMG5364

## 2019-10-03 HISTORY — PX: IR US GUIDE VASC ACCESS RIGHT: IMG2390

## 2019-10-03 LAB — CBC WITH DIFFERENTIAL/PLATELET
Abs Immature Granulocytes: 0.02 10*3/uL (ref 0.00–0.07)
Basophils Absolute: 0.1 10*3/uL (ref 0.0–0.1)
Basophils Relative: 1 %
Eosinophils Absolute: 0.2 10*3/uL (ref 0.0–0.5)
Eosinophils Relative: 2 %
HCT: 46.3 % (ref 39.0–52.0)
Hemoglobin: 15.7 g/dL (ref 13.0–17.0)
Immature Granulocytes: 0 %
Lymphocytes Relative: 28 %
Lymphs Abs: 2.8 10*3/uL (ref 0.7–4.0)
MCH: 31.3 pg (ref 26.0–34.0)
MCHC: 33.9 g/dL (ref 30.0–36.0)
MCV: 92.2 fL (ref 80.0–100.0)
Monocytes Absolute: 0.8 10*3/uL (ref 0.1–1.0)
Monocytes Relative: 8 %
Neutro Abs: 6.1 10*3/uL (ref 1.7–7.7)
Neutrophils Relative %: 61 %
Platelets: 253 10*3/uL (ref 150–400)
RBC: 5.02 MIL/uL (ref 4.22–5.81)
RDW: 11.9 % (ref 11.5–15.5)
WBC: 9.9 10*3/uL (ref 4.0–10.5)
nRBC: 0 % (ref 0.0–0.2)

## 2019-10-03 LAB — BASIC METABOLIC PANEL
Anion gap: 9 (ref 5–15)
BUN: 8 mg/dL (ref 8–23)
CO2: 24 mmol/L (ref 22–32)
Calcium: 8.9 mg/dL (ref 8.9–10.3)
Chloride: 106 mmol/L (ref 98–111)
Creatinine, Ser: 0.72 mg/dL (ref 0.61–1.24)
GFR calc Af Amer: >60 mL/min (ref >60)
GFR calc non Af Amer: >60 mL/min (ref >60)
Glucose, Bld: 135 mg/dL — ABNORMAL HIGH (ref 70–99)
Potassium: 3.8 mmol/L (ref 3.5–5.1)
Sodium: 139 mmol/L (ref 135–145)

## 2019-10-03 LAB — TSH: TSH: 4.178 u[IU]/mL (ref 0.350–4.500)

## 2019-10-03 LAB — GLUCOSE, CAPILLARY
Glucose-Capillary: 138 mg/dL — ABNORMAL HIGH (ref 70–99)
Glucose-Capillary: 146 mg/dL — ABNORMAL HIGH (ref 70–99)
Glucose-Capillary: 125 mg/dL — ABNORMAL HIGH (ref 70–99)
Glucose-Capillary: 104 mg/dL — ABNORMAL HIGH (ref 70–99)
Glucose-Capillary: 99 mg/dL (ref 70–99)
Glucose-Capillary: 179 mg/dL — ABNORMAL HIGH (ref 70–99)

## 2019-10-03 LAB — MAGNESIUM: Magnesium: 2.2 mg/dL (ref 1.7–2.4)

## 2019-10-03 MED ORDER — MIDAZOLAM HCL 2 MG/2ML IJ SOLN
INTRAMUSCULAR | Status: AC
  Filled 2019-10-03: qty 2

## 2019-10-03 MED ORDER — CLOPIDOGREL BISULFATE 75 MG PO TABS
150.0000 mg | ORAL_TABLET | Freq: Once | ORAL | Status: AC
  Administered 2019-10-04: 150 mg via ORAL
  Filled 2019-10-03: qty 2

## 2019-10-03 MED ORDER — ASPIRIN 325 MG PO TABS
325.0000 mg | ORAL_TABLET | Freq: Once | ORAL | Status: AC
  Administered 2019-10-04: 325 mg via ORAL
  Filled 2019-10-03: qty 1

## 2019-10-03 MED ORDER — HEPARIN SODIUM (PORCINE) 1000 UNIT/ML IJ SOLN
INTRAMUSCULAR | Status: AC | PRN
  Administered 2019-10-03: 2000 [IU] via INTRA_ARTERIAL

## 2019-10-03 MED ORDER — SODIUM CHLORIDE 0.9 % IV SOLN: INTRAVENOUS | Status: AC

## 2019-10-03 MED ORDER — IOHEXOL 300 MG/ML  SOLN
150.0000 mL | Freq: Once | INTRAMUSCULAR | Status: AC | PRN
  Administered 2019-10-03: 70 mL via INTRA_ARTERIAL

## 2019-10-03 MED ORDER — FENTANYL CITRATE (PF) 100 MCG/2ML IJ SOLN
INTRAMUSCULAR | Status: AC | PRN
  Administered 2019-10-03 (×2): 25 ug via INTRAVENOUS

## 2019-10-03 MED ORDER — LIDOCAINE HCL 1 % IJ SOLN
INTRAMUSCULAR | Status: AC
  Filled 2019-10-03: qty 20

## 2019-10-03 MED ORDER — ENOXAPARIN SODIUM 40 MG/0.4ML ~~LOC~~ SOLN: 40.0000 mg | SUBCUTANEOUS | Status: DC

## 2019-10-03 MED ORDER — CLOPIDOGREL BISULFATE 75 MG PO TABS
150.0000 mg | ORAL_TABLET | ORAL | Status: AC
  Administered 2019-10-03: 150 mg via ORAL
  Filled 2019-10-03: qty 2

## 2019-10-03 MED ORDER — HEPARIN SODIUM (PORCINE) 1000 UNIT/ML IJ SOLN
INTRAMUSCULAR | Status: AC
  Filled 2019-10-03: qty 1

## 2019-10-03 MED ORDER — ENOXAPARIN SODIUM 40 MG/0.4ML ~~LOC~~ SOLN
40.0000 mg | SUBCUTANEOUS | Status: DC
Start: 2019-10-05 — End: 2019-10-03

## 2019-10-03 MED ORDER — VERAPAMIL HCL 2.5 MG/ML IV SOLN
INTRAVENOUS | Status: AC | PRN
  Administered 2019-10-03: 2.5 mg via INTRA_ARTERIAL

## 2019-10-03 MED ORDER — VERAPAMIL HCL 2.5 MG/ML IV SOLN
INTRAVENOUS | Status: AC
  Filled 2019-10-03: qty 2

## 2019-10-03 MED ORDER — NITROGLYCERIN 1 MG/10 ML FOR IR/CATH LAB
INTRA_ARTERIAL | Status: AC
  Filled 2019-10-03: qty 10

## 2019-10-03 MED ORDER — NITROGLYCERIN 1 MG/10 ML FOR IR/CATH LAB
INTRA_ARTERIAL | Status: AC | PRN
  Administered 2019-10-03 (×2): 200 ug via INTRA_ARTERIAL

## 2019-10-03 MED ORDER — FENTANYL CITRATE (PF) 100 MCG/2ML IJ SOLN
INTRAMUSCULAR | Status: AC
  Filled 2019-10-03: qty 2

## 2019-10-03 MED ORDER — LIVING WELL WITH DIABETES BOOK
Freq: Once | Status: AC
  Filled 2019-10-03: qty 1

## 2019-10-03 MED ORDER — MIDAZOLAM HCL 2 MG/2ML IJ SOLN
INTRAMUSCULAR | Status: AC | PRN
  Administered 2019-10-03: 1 mg via INTRAVENOUS

## 2019-10-03 NOTE — Progress Notes (Signed)
STROKE TEAM PROGRESS NOTE   INTERVAL HISTORY Nurse is at bedside. No neurological changes.He is complaining about being NPO and just wants a cup of coffee. left-sided weakness remains much improved today. Awaiting diagnostic cerebral catheter angiogram. Vitals:   10/03/19 0016 10/03/19 0422 10/03/19 0742 10/03/19 1146  BP: (!) 142/70 (!) 157/55 (!) 154/77 (!) 145/103  Pulse: 65 63 65 66  Resp: 19 18 20 18   Temp: 98.2 F (36.8 C) 98.9 F (37.2 C) 98 F (36.7 C) (!) 97.5 F (36.4 C)  TempSrc: Oral Axillary Oral Oral  SpO2: 97% 97% 98% 98%  Weight:      Height:        CBC:  Recent Labs  Lab 10/01/19 1227 10/03/19 0505  WBC 11.4* 9.9  NEUTROABS 7.8* 6.1  HGB 16.6 15.7  HCT 48.6 46.3  MCV 93.1 92.2  PLT 269 253    Basic Metabolic Panel:  Recent Labs  Lab 10/01/19 1227 10/03/19 0505  NA 135 139  K 4.0 3.8  CL 103 106  CO2 23 24  GLUCOSE 245* 135*  BUN 12 8  CREATININE 0.76 0.72  CALCIUM 9.1 8.9  MG  --  2.2   Lipid Panel:     Component Value Date/Time   CHOL 199 10/02/2019 0428   TRIG 226 (H) 10/02/2019 0428   HDL 22 (L) 10/02/2019 0428   CHOLHDL 9.0 10/02/2019 0428   VLDL 45 (H) 10/02/2019 0428   LDLCALC 132 (H) 10/02/2019 0428   HgbA1c:  Lab Results  Component Value Date   HGBA1C 7.6 (H) 10/02/2019   Urine Drug Screen: No results found for: LABOPIA, COCAINSCRNUR, LABBENZ, AMPHETMU, THCU, LABBARB  Alcohol Level No results found for: ETH  IMAGING past 24 hours VAS 10/04/2019 CAROTID  Result Date: 10/02/2019 Carotid Arterial Duplex Study Indications:   CVA and Carotid artery disease. Other Factors: CT showed occluded ICA. Performing Technologist: 10/04/2019 RDMS, RVT  Examination Guidelines: A complete evaluation includes B-mode imaging, spectral Doppler, color Doppler, and power Doppler as needed of all accessible portions of each vessel. Bilateral testing is considered an integral part of a complete examination. Limited examinations for reoccurring indications  may be performed as noted.  Right Carotid Findings: +----------+--------+--------+--------+------------------+---------------------+           PSV cm/sEDV cm/sStenosisPlaque DescriptionComments              +----------+--------+--------+--------+------------------+---------------------+ CCA Prox  81      9                                                       +----------+--------+--------+--------+------------------+---------------------+ CCA Distal42      5                                                       +----------+--------+--------+--------+------------------+---------------------+ ICA Prox  99              Occluded                  near occlusion,  minimal flow noted                                                        proximally            +----------+--------+--------+--------+------------------+---------------------+ ECA       183     19                                                      +----------+--------+--------+--------+------------------+---------------------+ +----------+--------+-------+--------+-------------------+           PSV cm/sEDV cmsDescribeArm Pressure (mmHG) +----------+--------+-------+--------+-------------------+ [id number]            Stenotic                    +----------+--------+-------+--------+-------------------+ +---------+--------+--+--------+-+---------+ VertebralPSV cm/s74EDV cm/s5Antegrade +---------+--------+--+--------+-+---------+  Left Carotid Findings: +----------+--------+--------+--------+------------------+--------+           PSV cm/sEDV cm/sStenosisPlaque DescriptionComments +----------+--------+--------+--------+------------------+--------+ CCA Prox  96      19                                         +----------+--------+--------+--------+------------------+--------+ CCA Distal69      21                                          +----------+--------+--------+--------+------------------+--------+ ICA Prox  58      21      1-39%   heterogenous               +----------+--------+--------+--------+------------------+--------+ ICA Distal107     30                                         +----------+--------+--------+--------+------------------+--------+ ECA       169     11                                         +----------+--------+--------+--------+------------------+--------+ +----------+--------+--------+----------------+-------------------+           PSV cm/sEDV cm/sDescribe        Arm Pressure (mmHG) +----------+--------+--------+----------------+-------------------+ [id number]             Multiphasic, WNL                    +----------+--------+--------+----------------+-------------------+ +---------+--------+--+--------+--+---------+ VertebralPSV cm/s91EDV cm/s29Antegrade +---------+--------+--+--------+--+---------+   Summary: Right Carotid: Evidence consistent with a total occlusion of the right ICA. Left Carotid: Velocities in the left ICA are consistent with a 1-39% stenosis. Vertebrals:  Bilateral vertebral arteries demonstrate antegrade flow. Subclavians: Normal flow hemodynamics were seen in bilateral subclavian              arteries. *See table(s) above for measurements and observations.  Electronically signed by Delia Heady MD on 10/02/2019 at 5:22:55 PM.    Final  PHYSICAL EXAM GEN: Obese middle-aged Caucasian male tearful, but no acute distress HENT: Positive fordrooling.  Respiratory: Negative forcoughand shortness of breath.  Cardiovascular: No murmur or gallop.  Marland Kitchen  Neurological: A&Ox4, speech is dysarthric. No anomia. No aphasia, PERRL, EOMI, dense left facial droop. Tongue is not midline. Shrug is weak on left. 4+/5 grip on left w/mild pronator drift. 4+/5 foot push, no drift on left lrg. Rt wnl. FTN, HTS wnl Did not test  gait  ASSESSMENT/PLAN David Hendrix is a 64 y.o. male with history of DM2 and HTN presenting with left side weakness, slurred speech, drooling and unable to eat/drink x 3 days. He did not receive IV t-PA due to out side of time window. No thrombectomy given 3 day time line and competed stroke.  Stroke: large distral RMCA  Large vessel embolic etiology d/t right carotid occlusion in neck  CTA head & neck R ICA occlusion-likely acute. IR consulted; awaiting Cerebral Angio   MRI  Large right frontal stroke  2D Echo normal ejection fraction 60 to 65%. No cardiac source of embolism   LDL 132  HgbA1c 7.6  lovenox for VTE prophylaxis    Diet   Diet NPO time specified Except for: Sips with Meds    none prior to admission, now on ASA, will add Plavix pending consults and angio (incase surgical procedure is needed); plan for DAPT x 3wks, then monotherapy with ASA only  Therapy recommendations: pending  Disposition:  pending  Hypertension  Home meds:  none  Stable . Permissive hypertension not indicated . Long-term BP goal normotensive  Hyperlipidemia  Home meds:  none  LDL 132, goal < 70  Add 40mg  Lipitor po daily  Continue statin at discharge  Diabetes type II Uncontrolled  Home meds:  none  HgbA1c 7.6, goal < 7.0  CBGs Recent Labs    10/03/19 0619 10/03/19 0744 10/03/19 1146  GLUCAP 138* 146* 125*      SSI  Other Stroke Risk Factors  Cigarette smoker; advised to stop smoking  Congestive heart failure  Hospital day # 2     [place], ARNP-C, ANVP-BC Pager: [place] Pager: [phone] 10/03/2019 1:26 PM  To contact Stroke Continuity provider, please refer to [email or web address] After hours, contact General Neurology

## 2019-10-03 NOTE — Progress Notes (Signed)
TR band completely deflated, no comlications, rt radial pulse strong, sensation rt hand and thumb present, vitals changed to Q47min. TR band left in place

## 2019-10-03 NOTE — Progress Notes (Signed)
Removed 3cc of air, no complications 

## 2019-10-03 NOTE — Procedures (Signed)
S/P 4 vessel cerebral arteriogram. RT rad approach. Findings. 1.Near occluded Rt ICA at the bulb with a probable string sign proximally. 2.RT VA ends in the PICA. 3.RT MCA fills retrogradely from the [place]  Via the Lt PCOM from the [place]. 4.Occluded superior division of the RT MCA.  S.David Sikora MD

## 2019-10-03 NOTE — Plan of Care (Signed)
  RD consulted for nutrition education regarding diabetes and stroke.   Lab Results  Component Value Date   HGBA1C 7.6 (H) 10/02/2019   Diabetes Coordinator in room at time of visit and assisted in education regarding diabetic diet.   Obtained dietary recall from pt who reports eating 2 meals per day on average and consumes chocolate pies and ice cream sandwiches frequently.   RD provided "Carbohydrate Counting for People with Diabetes" and "Stroke Nutrition therapy" handouts from the Academy of [employer]. Discussed different food groups and their effects on blood sugar, emphasizing carbohydrate-containing foods. Provided list of carbohydrates and recommended serving sizes of common foods. Also discussed importance of limiting salt intake and various sources of sodium in the diet. Emphasized importance of lean meats and fresh produce.   Discussed importance of controlled and consistent carbohydrate intake throughout the day. Provided examples of ways to balance meals/snacks and encouraged intake of high-fiber, whole grain complex carbohydrates. Teach back method used.  Expect fair compliance.  Body mass index is 30.13 kg/m. Pt meets criteria for obesity based on current BMI.  Current diet order is NPO for procedure (was Dysphagia 2 with thin liquids). Per RN, patient was consuming approximately 100% of meals at this time. Labs and medications reviewed. No further nutrition interventions warranted at this time. RD contact information provided. If additional nutrition issues arise, please re-consult RD.  Eugene Gavia, MS, RD, LDN RD pager number and weekend/on-call pager number located in [place].

## 2019-10-03 NOTE — Progress Notes (Signed)
Inpatient Rehab Admissions:  Inpatient Rehab Consult received.  I met with patient and his daughter at the bedside for rehabilitation assessment and to discuss goals and expectations of an inpatient rehab admission.  Per discussion with Dr. Posey Pronto (formal consult pending), pt currently mobilizing too well for CIR and his recommendations are f/u in [facility] versus OP.  Pt and family in agreement and would rather pt discharge home as well.  Will sign off for CIR at this time.   Signed: Shann Medal, PT, DPT Admissions Coordinator [phone] 10/03/19  3:39 PM

## 2019-10-03 NOTE — Progress Notes (Signed)
STROKE TEAM PROGRESS NOTE   INTERVAL HISTORY Dr Annamaria Boots is at the bedside. No neurological changes. He states his left-sided weakness is a lot improved today. Carotid ultrasound done yesterday confirmed right carotid occlusion. Diagnostic cerebral catheter angiogram to look for string sign is pending. 2D echo was unremarkable. Vitals:   10/03/19 0016 10/03/19 0422 10/03/19 0742 10/03/19 1146  BP: (!) 142/70 (!) 157/55 (!) 154/77 (!) 145/103  Pulse: 65 63 65 66  Resp: 19 18 20 18   Temp: 98.2 F (36.8 C) 98.9 F (37.2 C) 98 F (36.7 C) (!) 97.5 F (36.4 C)  TempSrc: Oral Axillary Oral Oral  SpO2: 97% 97% 98% 98%  Weight:      Height:        CBC:  Recent Labs  Lab 10/01/19 1227 10/03/19 0505  WBC 11.4* 9.9  NEUTROABS 7.8* 6.1  HGB 16.6 15.7  HCT 48.6 46.3  MCV 93.1 92.2  PLT 269 149    Basic Metabolic Panel:  Recent Labs  Lab 10/01/19 1227 10/03/19 0505  NA 135 139  K 4.0 3.8  CL 103 106  CO2 23 24  GLUCOSE 245* 135*  BUN 12 8  CREATININE 0.76 0.72  CALCIUM 9.1 8.9  MG  --  2.2   Lipid Panel:     Component Value Date/Time   CHOL 199 10/02/2019 0428   TRIG 226 (H) 10/02/2019 0428   HDL 22 (L) 10/02/2019 0428   CHOLHDL 9.0 10/02/2019 0428   VLDL 45 (H) 10/02/2019 0428   LDLCALC 132 (H) 10/02/2019 0428   HgbA1c:  Lab Results  Component Value Date   HGBA1C 7.6 (H) 10/02/2019   Urine Drug Screen: No results found for: LABOPIA, COCAINSCRNUR, LABBENZ, AMPHETMU, THCU, LABBARB  Alcohol Level No results found for: ETH  IMAGING past 24 hours VAS US CAROTID  Result Date: 10/02/2019 Carotid Arterial Duplex Study Indications:   CVA and Carotid artery disease. Other Factors: CT showed occluded ICA. Performing Technologist: June Leap RDMS, RVT  Examination Guidelines: A complete evaluation includes B-mode imaging, spectral Doppler, color Doppler, and power Doppler as needed of all accessible portions of each vessel. Bilateral testing is considered an integral part of  a complete examination. Limited examinations for reoccurring indications may be performed as noted.  Right Carotid Findings: +----------+--------+--------+--------+------------------+---------------------+           PSV cm/sEDV cm/sStenosisPlaque DescriptionComments              +----------+--------+--------+--------+------------------+---------------------+ CCA Prox  81      9                                                       +----------+--------+--------+--------+------------------+---------------------+ CCA Distal42      5                                                       +----------+--------+--------+--------+------------------+---------------------+ ICA Prox  99              Occluded                  near occlusion,  minimal flow noted                                                        proximally            +----------+--------+--------+--------+------------------+---------------------+ ECA       183     19                                                      +----------+--------+--------+--------+------------------+---------------------+ +----------+--------+-------+--------+-------------------+           PSV cm/sEDV cmsDescribeArm Pressure (mmHG) +----------+--------+-------+--------+-------------------+ [id number]            Stenotic                    +----------+--------+-------+--------+-------------------+ +---------+--------+--+--------+-+---------+ VertebralPSV cm/s74EDV cm/s5Antegrade +---------+--------+--+--------+-+---------+  Left Carotid Findings: +----------+--------+--------+--------+------------------+--------+           PSV cm/sEDV cm/sStenosisPlaque DescriptionComments +----------+--------+--------+--------+------------------+--------+ CCA Prox  96      19                                          +----------+--------+--------+--------+------------------+--------+ CCA Distal69      21                                         +----------+--------+--------+--------+------------------+--------+ ICA Prox  58      21      1-39%   heterogenous               +----------+--------+--------+--------+------------------+--------+ ICA Distal107     30                                         +----------+--------+--------+--------+------------------+--------+ ECA       169     11                                         +----------+--------+--------+--------+------------------+--------+ +----------+--------+--------+----------------+-------------------+           PSV cm/sEDV cm/sDescribe        Arm Pressure (mmHG) +----------+--------+--------+----------------+-------------------+ [id number]             Multiphasic, WNL                    +----------+--------+--------+----------------+-------------------+ +---------+--------+--+--------+--+---------+ VertebralPSV cm/s91EDV cm/s29Antegrade +---------+--------+--+--------+--+---------+   Summary: Right Carotid: Evidence consistent with a total occlusion of the right ICA. Left Carotid: Velocities in the left ICA are consistent with a 1-39% stenosis. Vertebrals:  Bilateral vertebral arteries demonstrate antegrade flow. Subclavians: Normal flow hemodynamics were seen in bilateral subclavian              arteries. *See table(s) above for measurements and observations.  Electronically signed by Delia Heady MD on 10/02/2019 at 5:22:55 PM.    Final  PHYSICAL EXAM GEN: Obese middle-aged Caucasian male tearful, but no acute distress HENT: Positive fordrooling.  Respiratory: Negative forcoughand shortness of breath.  Cardiovascular: No murmur or gallop.  Marland Kitchen  Neurological: A&Ox4, speech is dysarthric. No anomia. No aphasia, PERRL, EOMI, dense left facial droop. Tongue is not midline.shrug is weak on left. 4/5 grip on  left w/drift. 4/5 foot push w/mild drift on left lrg. Rt wnl. FTN, HTS wnl Did not test gait  ASSESSMENT/PLAN [name] is a 64 y.o. male with history of DM2 and HTN presenting with left side weakness, slurred speech, drooling and unable to eat/drink x 3 days. He did not receive IV t-PA due to out side of time window. No thrombectomy given 3 day time line and competed stroke.  Stroke:  Embolic appearing right frontal stroke d/t right carotid occlusion in neck  CTA head & neck  ICA occlusion-likely acute. IR consulted  MRI  Large right frontal stroke  2D Echo normal ejection fraction 60 to 65%. No cardiac source of embolism LDL 132  HgbA1c 7.6  lovenox for VTE prophylaxis    Diet   Diet NPO time specified Except for: Sips with Meds    none prior to admission, now on ASA, will add Plavix pending vascular consult (incase surgical procedure is needed)  Therapy recommendations: pending  Disposition:  pending  Hypertension  Home meds:  none  Stable . Permissive hypertension not indicated . Long-term BP goal normotensive  Hyperlipidemia  Home meds:  none  LDL 132, goal < 70  Add 40mg  Lipitor po daily  Continue statin at discharge  Diabetes type II Uncontrolled  Home meds:  none  HgbA1c 7.6, goal < 7.0  CBGs Recent Labs    10/03/19 0619 10/03/19 0744 10/03/19 1146  GLUCAP 138* 146* 125*      SSI  Other Stroke Risk Factors  Cigarette smoker; advised to stop smoking  ongestive heart failure  Hospital day # 2    He presented with dysarthria and left facial droop secondary to embolic right MCA branch infarct from right carotid occlusion   Recommend aspirin and Plavix and aggressive risk factor modification.  Check   diagnostic cerebral catheter angiogram which is available sooner to look for string sign versus total occlusion.  Patient counseled to quit smoking and is agreeable.    Discussed with Dr. 10/05/19 and Dr. Parke Poisson.  Greater than 50% time  during this 25-minute visit was spent on counseling and coordination of care about his carotid occlusion and stroke and answering questions  Corliss Skains, MD Medical Director [facility] Pager: [phone] 10/03/2019 12:36 PM  To contact Stroke Continuity provider, please refer to 10/05/2019. After hours, contact General Neurology

## 2019-10-03 NOTE — Progress Notes (Signed)
Patient off unit for procedure.

## 2019-10-03 NOTE — Progress Notes (Addendum)
PROGRESS NOTE  David Hendrix FFM:[id number] DOB: 24-Feb-1956 DOA: 10/01/2019 PCP: Patient, No Pcp Per  Chief Complaint: Drooling and trouble swallowing  HPI: David Hendrix is a 64 y.o. male with medical history significant of HTN and IIDM, but non-compliant with medications due to insurance issue.  Patient started to feel sick 3 days ago, with some trouble swallowing food and water think the food and water just slipped off the corner of his lips, but no significant coughing or choking no significant weakness at that point.  Today, while in the laundromat patient was told by a bystander that he has left facial droop as sign of stroke and he was brought to the ER for evaluation.  Patient denied any weakness of any of the limbs were numb or tingling of any of the limbs, no blurred vision or hearing changes no headache. ED Course: CT shows right frontal acute stroke   HPI/Recap of past 24 hours:  He is n.p.o. awaiting for IR procedure, he wants to eat Continue to have left facial droop Denies pain,   Assessment/Plan: Active Problems:   CVA (cerebral vascular accident) ([place])  Acute CVA resulting facial droop and trouble holding foot in mouth Acte cva likely  Embolic appearing right frontal stroke d/t vessel disease source per neurology MRI  Large right frontal stroke Echo cardiogram no embolic source reported, LVEF 60% with grade 1 diastolic dysfunction CTA head & neck ICA occlusion-likely acute. IR consulted He is currently on asa/Plavix/lipitor Neurology/IR  input appreciated    Addendum: plan to place stent to r ICA, keep npo aftermidnight, hold lovenox,  Diet modification per speech: Dysphagia 2 (Fine chop);Thin liquid  Liquid Administration via: Cup;Straw Medication Administration: Whole meds with puree Supervision: Patient able to self feed Compensations: Slow rate;Small sips/bites;Minimize environmental distractions Postural Changes: Seated upright at 90 degrees   Per  PT, although patient strength is intact he does has decreased coordination decreased balance and decreased safety awareness  Uncontrolled type 2 diabetes base hyperglycemia -A1c 7.6 -He was previously not taking any medication, currently on SSI -Consider start Metformin at discharge -Diet and diabetes education  HLD Started lipitor  Body mass index is 30.13 kg/m.   Cigarette smoking; smoking cessation education provided, he declined nicotine patch  DVT Prophylaxis: Subcu Lovenox  Code Status: full  Family Communication: patient   Disposition Plan:    Patient came from:                 home                                                                                         Anticipated d/c place: [facility]  Barriers to d/c OR conditions which need to be met to effect a safe d/c:  Needs diagnostic angiogram, then cir   Consultants:  Neurology  Neuro interventionalist   Procedures:  Angiogram  Antibiotics:  None   Objective: BP (!) 145/103 (BP Location: Left Arm)   Pulse 66   Temp (!) 97.5 F (36.4 C) (Oral)   Resp 18   Ht _0  (1.778 m)   Wt 95.3 kg   SpO2 98%  BMI 30.13 kg/m   Intake/Output Summary (Last 24 hours) at 10/03/2019 1231 Last data filed at 10/03/2019 0422 Gross per 24 hour  Intake 240 ml  Output 1000 ml  Net -760 ml   Filed Weights   10/01/19 1217  Weight: 95.3 kg    Exam: Patient is examined daily including today on 10/03/2019, exams remain the same as of yesterday except that has changed    General:  NAD  Cardiovascular: RRR  Respiratory: CTABL  Abdomen: Soft/ND/NT, positive BS  Musculoskeletal: No Edema  Neuro: alert, oriented , left facial droop  Data Reviewed: Basic Metabolic Panel: Recent Labs  Lab 10/01/19 1227 10/03/19 0505  NA 135 139  K 4.0 3.8  CL 103 106  CO2 23 24  GLUCOSE 245* 135*  BUN 12 8  CREATININE 0.76 0.72  CALCIUM 9.1 8.9  MG  --  2.2   Liver Function Tests: Recent Labs  Lab  10/01/19 1227  AST 14*  ALT 13  ALKPHOS 66  BILITOT 1.1  PROT 7.5  ALBUMIN 4.3   No results for input(s): LIPASE, AMYLASE in the last 168 hours. No results for input(s): AMMONIA in the last 168 hours. CBC: Recent Labs  Lab 10/01/19 1227 10/03/19 0505  WBC 11.4* 9.9  NEUTROABS 7.8* 6.1  HGB 16.6 15.7  HCT 48.6 46.3  MCV 93.1 92.2  PLT 269 253   Cardiac Enzymes:   No results for input(s): CKTOTAL, CKMB, CKMBINDEX, TROPONINI in the last 168 hours. BNP (last 3 results) No results for input(s): BNP in the last 8760 hours.  ProBNP (last 3 results) No results for input(s): PROBNP in the last 8760 hours.  CBG: Recent Labs  Lab 10/02/19 2003 10/02/19 2138 10/03/19 0619 10/03/19 0744 10/03/19 1146  GLUCAP 149* 187* 138* 146* 125*    Recent Results (from the past 240 hour(s))  Respiratory Panel by RT PCR (Flu A&B, Covid) - Nasopharyngeal Swab     Status: None   Collection Time: 10/01/19  4:30 PM   Specimen: Nasopharyngeal Swab  Result Value Ref Range Status   SARS Coronavirus 2 by RT PCR NEGATIVE NEGATIVE Final    Comment: (NOTE) SARS-CoV-2 target nucleic acids are NOT DETECTED. The SARS-CoV-2 RNA is generally detectable in upper respiratoy specimens during the acute phase of infection. The lowest concentration of SARS-CoV-2 viral copies this assay can detect is 131 copies/mL. A negative result does not preclude SARS-Cov-2 infection and should not be used as the sole basis for treatment or other patient management decisions. A negative result may occur with  improper specimen collection/handling, submission of specimen other than nasopharyngeal swab, presence of viral mutation(s) within the areas targeted by this assay, and inadequate number of viral copies (<131 copies/mL). A negative result must be combined with clinical observations, patient history, and epidemiological information. The expected result is Negative. Fact Sheet for Patients:    PinkCheek.be Fact Sheet for Healthcare Providers:  GravelBags.it This test is not yet ap proved or cleared by the [place] FDA and  has been authorized for detection and/or diagnosis of SARS-CoV-2 by FDA under an Emergency Use Authorization (EUA). This EUA will remain  in effect (meaning this test can be used) for the duration of the COVID-19 declaration under Section 564(b)(1) of the Act, 21 U.S.C. section 360bbb-3(b)(1), unless the authorization is terminated or revoked sooner.    Influenza A by PCR NEGATIVE NEGATIVE Final   Influenza B by PCR NEGATIVE NEGATIVE Final    Comment: (NOTE) The Xpert Xpress  SARS-CoV-2/FLU/RSV assay is intended as an aid in  the diagnosis of influenza from Nasopharyngeal swab specimens and  should not be used as a sole basis for treatment. Nasal washings and  aspirates are unacceptable for Xpert Xpress SARS-CoV-2/FLU/RSV  testing. Fact Sheet for Patients: PinkCheek.be Fact Sheet for Healthcare Providers: GravelBags.it This test is not yet approved or cleared by the [place] FDA and  has been authorized for detection and/or diagnosis of SARS-CoV-2 by  FDA under an Emergency Use Authorization (EUA). This EUA will remain  in effect (meaning this test can be used) for the duration of the  Covid-19 declaration under Section 564(b)(1) of the Act, 21  U.S.C. section 360bbb-3(b)(1), unless the authorization is  terminated or revoked. Performed at [facility], [place] [address], [place],  [address]      Studies: VAS US CAROTID  Result Date: 10/02/2019 Carotid Arterial Duplex Study Indications:   CVA and Carotid artery disease. Other Factors: CT showed occluded ICA. Performing Technologist: June Leap RDMS, RVT  Examination Guidelines: A complete evaluation includes B-mode imaging, spectral Doppler, color Doppler, and power  Doppler as needed of all accessible portions of each vessel. Bilateral testing is considered an integral part of a complete examination. Limited examinations for reoccurring indications may be performed as noted.  Right Carotid Findings: +----------+--------+--------+--------+------------------+---------------------+           PSV cm/sEDV cm/sStenosisPlaque DescriptionComments              +----------+--------+--------+--------+------------------+---------------------+ CCA Prox  81      9                                                       +----------+--------+--------+--------+------------------+---------------------+ CCA Distal42      5                                                       +----------+--------+--------+--------+------------------+---------------------+ ICA Prox  99              Occluded                  near occlusion,                                                           minimal flow noted                                                        proximally            +----------+--------+--------+--------+------------------+---------------------+ ECA       183     19                                                      +----------+--------+--------+--------+------------------+---------------------+ +----------+--------+-------+--------+-------------------+  PSV cm/sEDV cmsDescribeArm Pressure (mmHG) +----------+--------+-------+--------+-------------------+ [id number]            Stenotic                    +----------+--------+-------+--------+-------------------+ +---------+--------+--+--------+-+---------+ VertebralPSV cm/s74EDV cm/s5Antegrade +---------+--------+--+--------+-+---------+  Left Carotid Findings: +----------+--------+--------+--------+------------------+--------+           PSV cm/sEDV cm/sStenosisPlaque DescriptionComments +----------+--------+--------+--------+------------------+--------+  CCA Prox  96      19                                         +----------+--------+--------+--------+------------------+--------+ CCA Distal69      21                                         +----------+--------+--------+--------+------------------+--------+ ICA Prox  58      21      1-39%   heterogenous               +----------+--------+--------+--------+------------------+--------+ ICA Distal107     30                                         +----------+--------+--------+--------+------------------+--------+ ECA       169     11                                         +----------+--------+--------+--------+------------------+--------+ +----------+--------+--------+----------------+-------------------+           PSV cm/sEDV cm/sDescribe        Arm Pressure (mmHG) +----------+--------+--------+----------------+-------------------+ [id number]             Multiphasic, WNL                    +----------+--------+--------+----------------+-------------------+ +---------+--------+--+--------+--+---------+ VertebralPSV cm/s91EDV cm/s29Antegrade +---------+--------+--+--------+--+---------+   Summary: Right Carotid: Evidence consistent with a total occlusion of the right ICA. Left Carotid: Velocities in the left ICA are consistent with a 1-39% stenosis. Vertebrals:  Bilateral vertebral arteries demonstrate antegrade flow. Subclavians: Normal flow hemodynamics were seen in bilateral subclavian              arteries. *See table(s) above for measurements and observations.  Electronically signed by Antony Contras MD on 10/02/2019 at 5:22:55 PM.    Final     Scheduled Meds: . aspirin  81 mg Oral Daily  . atorvastatin  40 mg Oral Daily  . clopidogrel  75 mg Oral Daily  . enoxaparin (LOVENOX) injection  40 mg Subcutaneous Q24H  . insulin aspart  0-9 Units Subcutaneous TID WC    Continuous Infusions:    Time spent: 22mns, case discussed with neurology I have  personally reviewed and interpreted on  10/03/2019 daily labs, tele strips, imagings as discussed above under date review session and assessment and plans.  I reviewed all nursing notes, pharmacy notes, consultant notes,  vitals, pertinent old records  I have discussed plan of care as described above with RN , patient  on 10/03/2019   FFlorencia ReasonsMD, PhD, FACP  [facility]  Available via Epic secure chat 7am-7pm for nonurgent issues Please page for urgent issues, pager number available through [email or web address] .  10/03/2019, 12:31 PM  LOS: 2 days

## 2019-10-03 NOTE — Progress Notes (Signed)
Inpatient Diabetes Program Recommendations  AACE/ADA: New Consensus Statement on Inpatient Glycemic Control (2015)  Target Ranges:  Prepandial:   less than 140 mg/dL      Peak postprandial:   less than 180 mg/dL (1-2 hours)      Critically ill patients:  140 - 180 mg/dL   Lab Results  Component Value Date   GLUCAP 125 (H) 10/03/2019   HGBA1C 7.6 (H) 10/02/2019    Review of Glycemic Control  Diabetes history: DM 2 Outpatient Diabetes medications: none Current orders for Inpatient glycemic control:  Novolog 0-9 units tid  Spoke with pt, son and daughter at bedside. Pt reports being diagnosed a little over a year ago and has been on metformin in the past, however he had to pay $100 for a PCP follow up in order to get refills, so he never went back.   Discussed current A1c of 7.7%. Discussed glucose and A1c goals. Discussed in detail regarding diet and exercise along with [facility]. Pt ate poorly and did not exercise prior to admission.  Pt has glucometer at home. Discussed when to check glucose at home. Possible for pt to come off of Metformin when able to be more active. Discussed s/s of hypoglycemia and treatment.  Agree with Metformin at time of d/c.  Thanks,  Christena Deem RN, MSN, BC-ADM Inpatient Diabetes Coordinator Team Pager [phone] (8a-5p)

## 2019-10-03 NOTE — Progress Notes (Signed)
Patient has strong rt radial pulse, patient has full sensation in rt hand and thumb.

## 2019-10-03 NOTE — Progress Notes (Signed)
Patient back from procedure.

## 2019-10-03 NOTE — Progress Notes (Signed)
Patient arrived with TR band in place on right arm with brace. Site clean dry and intact rt radial pulse and sensation

## 2019-10-03 NOTE — Progress Notes (Signed)
Based on findings of cerebral angiogram this afternoon, plan made to proceed with intervention tomorrow afternoon.   Per Dr. Corliss Skains, patient to load with Plavix 150 mg (total) this evening, and second loading dose of 150 mg tomorrow morning.   NPO p MN for possible procedure tomorrow.   Assess P2Y12 tomorrow AM after loading doses of antiplatelets given.   Spoke with RN  Loyce Dys, MS RD PA-C

## 2019-10-03 NOTE — Progress Notes (Signed)
Removed 3cc of air, no complications, rt hand/thumb sensation intact and rt radial pulse

## 2019-10-03 NOTE — Sedation Documentation (Signed)
CO2 monitoring removed per Dr. Corliss Skains for pictures.  Patient is maintaining his oxygen at 97% room air.  Patient is able to follow commands.

## 2019-10-03 NOTE — Sedation Documentation (Signed)
Bedside handoff given. Vascular assessment completed of right radial site. Strong pulse intact, cap refill and sensation intact

## 2019-10-03 NOTE — Progress Notes (Signed)
Removed 3cc of air, no complications

## 2019-10-03 NOTE — Progress Notes (Signed)
PT Cancellation Note  Patient Details Name: David Hendrix MRN: [id number] DOB: 21-Dec-1955   Cancelled Treatment:    Reason Eval/Treat Not Completed: Patient at procedure or test/unavailable;Active bedrest order. Upon PT initial arrival, pt leaving floor for procedure, and upon PT return after pt procedure, the pt was on active bedrest orders. PT will continue to follow and treat as time/schedule allow.   [name], PT, DPT   Acute Rehabilitation Department Pager #: [phone]   [name] 10/03/2019, 3:40 PM

## 2019-10-04 ENCOUNTER — Encounter (HOSPITAL_COMMUNITY): Payer: Self-pay | Admitting: Anesthesiology

## 2019-10-04 ENCOUNTER — Encounter (HOSPITAL_COMMUNITY)
Admission: EM | Disposition: A | Discharge status: Home Health | Payer: Self-pay | Source: Home / Self Care | Attending: Internal Medicine

## 2019-10-04 ENCOUNTER — Other Ambulatory Visit (HOSPITAL_COMMUNITY): Payer: Medicare Other

## 2019-10-04 ENCOUNTER — Emergency Department (HOSPITAL_COMMUNITY)
Admission: EM | Admit: 2019-10-04 | Discharge: 2019-10-05 | Disposition: A | Discharge status: Home / Self Care | Payer: Medicare Other | Source: Home / Self Care

## 2019-10-04 DIAGNOSIS — E7849 Other hyperlipidemia: Secondary | ICD-10-CM

## 2019-10-04 DIAGNOSIS — R6889 Other general symptoms and signs: Secondary | ICD-10-CM | POA: Insufficient documentation

## 2019-10-04 DIAGNOSIS — Z5321 Procedure and treatment not carried out due to patient leaving prior to being seen by health care provider: Secondary | ICD-10-CM | POA: Insufficient documentation

## 2019-10-04 DIAGNOSIS — I6521 Occlusion and stenosis of right carotid artery: Secondary | ICD-10-CM | POA: Diagnosis not present

## 2019-10-04 LAB — CBC WITH DIFFERENTIAL/PLATELET
Abs Immature Granulocytes: 0.03 10*3/uL (ref 0.00–0.07)
Basophils Absolute: 0.1 10*3/uL (ref 0.0–0.1)
Basophils Relative: 1 %
Eosinophils Absolute: 0.2 10*3/uL (ref 0.0–0.5)
Eosinophils Relative: 2 %
HCT: 46 % (ref 39.0–52.0)
Hemoglobin: 16.1 g/dL (ref 13.0–17.0)
Immature Granulocytes: 0 %
Lymphocytes Relative: 26 %
Lymphs Abs: 2.4 10*3/uL (ref 0.7–4.0)
MCH: 31.9 pg (ref 26.0–34.0)
MCHC: 35 g/dL (ref 30.0–36.0)
MCV: 91.3 fL (ref 80.0–100.0)
Monocytes Absolute: 0.7 10*3/uL (ref 0.1–1.0)
Monocytes Relative: 8 %
Neutro Abs: 6.1 10*3/uL (ref 1.7–7.7)
Neutrophils Relative %: 63 %
Platelets: 281 10*3/uL (ref 150–400)
RBC: 5.04 MIL/uL (ref 4.22–5.81)
RDW: 11.8 % (ref 11.5–15.5)
WBC: 9.5 10*3/uL (ref 4.0–10.5)
nRBC: 0 % (ref 0.0–0.2)

## 2019-10-04 LAB — GLUCOSE, CAPILLARY
Glucose-Capillary: 134 mg/dL — ABNORMAL HIGH (ref 70–99)
Glucose-Capillary: 118 mg/dL — ABNORMAL HIGH (ref 70–99)
Glucose-Capillary: 156 mg/dL — ABNORMAL HIGH (ref 70–99)

## 2019-10-04 LAB — PLATELET INHIBITION P2Y12: Platelet Function  P2Y12: 137 [PRU] — ABNORMAL LOW (ref 182–335)

## 2019-10-04 LAB — BASIC METABOLIC PANEL
Anion gap: 8 (ref 5–15)
BUN: 11 mg/dL (ref 8–23)
CO2: 22 mmol/L (ref 22–32)
Calcium: 8.9 mg/dL (ref 8.9–10.3)
Chloride: 107 mmol/L (ref 98–111)
Creatinine, Ser: 0.67 mg/dL (ref 0.61–1.24)
GFR calc Af Amer: >60 mL/min (ref >60)
GFR calc non Af Amer: >60 mL/min (ref >60)
Glucose, Bld: 136 mg/dL — ABNORMAL HIGH (ref 70–99)
Potassium: 3.8 mmol/L (ref 3.5–5.1)
Sodium: 137 mmol/L (ref 135–145)

## 2019-10-04 SURGERY — RADIOLOGY WITH ANESTHESIA
Anesthesia: Monitor Anesthesia Care

## 2019-10-04 MED ORDER — ATORVASTATIN CALCIUM 40 MG PO TABS: 40.0000 mg | ORAL_TABLET | Freq: Every day | ORAL | 0 refills | Status: AC

## 2019-10-04 MED ORDER — ASPIRIN 325 MG PO TABS
325.0000 mg | ORAL_TABLET | Freq: Every day | ORAL | Status: DC
  Administered 2019-10-04: 325 mg via ORAL
  Filled 2019-10-04: qty 1

## 2019-10-04 MED ORDER — METFORMIN HCL 500 MG PO TABS: 500.0000 mg | ORAL_TABLET | Freq: Two times a day (BID) | ORAL | 0 refills | Status: AC

## 2019-10-04 MED ORDER — CLOPIDOGREL BISULFATE 75 MG PO TABS
75.0000 mg | ORAL_TABLET | Freq: Every day | ORAL | 0 refills | Status: DC
Start: 2019-10-04 — End: 2019-10-10

## 2019-10-04 MED ORDER — LISINOPRIL 5 MG PO TABS: 5.0000 mg | ORAL_TABLET | Freq: Every day | ORAL | 0 refills | Status: DC

## 2019-10-04 MED ORDER — ASPIRIN EC 81 MG PO TBEC: 81.0000 mg | DELAYED_RELEASE_TABLET | Freq: Every day | ORAL | 0 refills | Status: DC

## 2019-10-04 MED ORDER — BLOOD GLUCOSE MONITOR KIT: PACK | 0 refills | Status: AC

## 2019-10-04 MED ORDER — CLOPIDOGREL BISULFATE 75 MG PO TABS
150.0000 mg | ORAL_TABLET | Freq: Once | ORAL | Status: AC
  Administered 2019-10-04: 09:00:00 150 mg via ORAL
  Filled 2019-10-04: qty 2

## 2019-10-04 MED FILL — ATORVASTATIN CALCIUM 40 MG: 40 | 30 days supply | Qty: 30 | Fill #0

## 2019-10-04 MED FILL — LISINOPRIL 5 MG TABS: 5 | 30 days supply | Qty: 30 | Fill #0

## 2019-10-04 MED FILL — ASPIRIN LOW DOSE 81 MG TBEC: 81 | 150 days supply | Qty: 150 | Fill #0

## 2019-10-04 MED FILL — CLOPIDOGREL 75 MG TABLET: 75 | 30 days supply | Qty: 30 | Fill #0

## 2019-10-04 MED FILL — metFORMIN HCL 500 MG TABS: 500 | 30 days supply | Qty: 60 | Fill #0

## 2019-10-04 NOTE — Discharge Summary (Addendum)
Discharge Summary  David Hendrix FMB:[id number] DOB: 16-May-1956  PCP: Patient, No Pcp Per  Admit date: 10/01/2019 Discharge date: 10/04/2019  Time spent: 53mns, more than 50% time spent on coordination of care.  Recommendations for Outpatient Follow-up:  1. F/u with PCP within a week  for hospital discharge follow up, repeat cbc/bmp at follow up 2. F/u with neurology   Discharge Diagnoses:  Active Hospital Problems   Diagnosis Date Noted  . Essential hypertension   . Controlled type 2 diabetes mellitus with hyperglycemia, without long-term current use of insulin ([facility]   . Noncompliance   . [place]re[place]) ([facility] 10/01/2019    Resolved Hospital Problems  No resolved problems to display.    Discharge Condition: stable  Diet recommendation: heart healthy/carb modified  Filed Weights   10/01/19 1217  Weight: 95.3 kg    History of present illness:  Patient coming from: Home  I have personally briefly reviewed [place] Chief Complaint: Drooling and trouble swallowing  HPI: David Hendrix a 64y.o. male with medical history significant of HTN and IIDM, but non-compliant with medications due to insurance issue.  Patient started to feel sick 3 days ago, with some trouble swallowing food and water think the food and water just slipped off the corner of his lips, but no significant coughing or choking no significant weakness at that point.  Today, while in the laundromat patient was told by a bystander that he has left facial droop as sign of stroke and he was brought to the ER for evaluation.  Patient denied any weakness of any of the limbs were numb or tingling of any of the limbs, no blurred vision or hearing changes no headache. ED Course: CT shows right frontal acute stroke  Hospital Course:  Active Problems:   CVA (cerebral vascular accident) ([facility]   Essential hypertension   Controlled type 2 diabetes  mellitus with hyperglycemia, without long-term current use of insulin (HCC)   Noncompliance  Acute CVA resulting facial droop and trouble holding foot in mouth Acute cva likely  Embolic appearing right frontal stroke d/tvessel disease source per neurology MRILarge right frontal stroke Echo cardiogram no embolic source reported, LVEF 60% with grade 1 diastolic dysfunction CTA head & neckICA occlusion-likely acute.IR consulted, plan to place stent to r ICA However, He is not willing to stay in the hospital but instead says he may come back next week for elective procedure Plan for asa/Plavix for three months then asa alone, continue lipitor Neurology/IR  input appreciated   Uncontrolled type 2 diabetes with hyperglycemia -A1c 7.6 -He was previously not taking any medication, currently on SSI - start Metformin at discharge -Diet and diabetes education  HLD Started lipitor  Body mass index is 30.13 kg/m.   Cigarette smoking; smoking cessation education provided, he declined nicotine patch  DVT Prophylaxis while in the hospital: Subcu Lovenox  Code Status: full  Family Communication: patient   Disposition Plan:    Patient came from:home  Anticipated d/c place:home with home health    Consultants:  Neurology  Neuro interventionalist   Procedures:  Angiogram  Antibiotics:  None   Discharge Exam: BP (!) 175/68 (BP Location: Right Arm)   Pulse (!) 58   Temp (!) 97.3 F (36.3 C) (Oral)   Resp 17   Ht _0  (1.778 m)   Wt 95.3 kg   SpO2 99%   BMI 30.13 kg/m    General:  NAD  Cardiovascular: RRR  Respiratory: CTABL  Abdomen: Soft/ND/NT, positive BS  Musculoskeletal: No Edema  Neuro: alert, oriented , left facial droop   Discharge Instructions You were cared for by a hospitalist during your hospital stay. If you have  any questions about your discharge medications or the care you received while you were in the hospital after you are discharged, you can call the unit and asked to speak with the hospitalist on call if the hospitalist that took care of you is not available. Once you are discharged, your primary care physician will handle any further medical issues. Please note that NO REFILLS for any discharge medications will be authorized once you are discharged, as it is imperative that you return to your primary care physician (or establish a relationship with a primary care physician if you do not have one) for your aftercare needs so that they can reassess your need for medications and monitor your lab values.  Discharge Instructions    Ambulatory referral to Neurology   Complete by: As directed    An appointment is requested in approximately: 4 weeks Stroke clinic   Ambulatory referral to Physical Medicine Rehab   Complete by: As directed    1 month post stroke hospital follow-up   Diet - low sodium heart healthy   Complete by: As directed    Low fat, carb modified diet   Discharge instructions   Complete by: As directed    Please check your blood pressure at home once a day, bring blood pressure record to your doctor for further blood pressure medication adjustment.  Please check your blood glucose at home as prescribed, bring blood sugar log to your PCP for diabetes management.   Increase activity slowly   Complete by: As directed      Allergies as of 10/04/2019   No Known Allergies     Medication List    TAKE these medications   aspirin EC 81 MG tablet Take 1 tablet (81 mg total) by mouth daily.   atorvastatin 40 MG tablet Commonly known as: LIPITOR Take 1 tablet (40 mg total) by mouth daily. Start taking on: Oct 05, 2019   blood glucose meter kit and supplies Kit Dispense based on patient and insurance preference. Use up to four times daily as directed. (FOR ICD-9 250.00, 250.01).     clopidogrel 75 MG tablet Commonly known as: Plavix Take 1 tablet (75 mg total) by mouth daily.   lisinopril 5 MG tablet Commonly known as: ZESTRIL Take 1 tablet (5 mg total) by mouth daily.   metFORMIN 500 MG tablet Commonly known as: Glucophage Take 1 tablet (500 mg total) by mouth 2 (two) times daily with a meal.      No Known Allergies Follow-up Information    [facility] Follow [facility].   Specialty: Home Health Services Why: The home health agency will contact you for the first home visit. Contact information: [address] [place] [address] [phone]        [facility] Follow up in 4 week(s).   Why: for stroke care Contact information: [address]     [place] [address] [phone]       follow up with pcp Follow up in 1 week(s).   Why: for diabetes care        Luanne Bras, MD Follow up.   Specialties: Interventional Radiology, Radiology Why: for carotid stent placement  Contact information: [address]  [place] [address] [phone]            The results of significant diagnostics from this hospitalization (including imaging, microbiology, ancillary and laboratory) are listed below for reference.    Significant Diagnostic Studies: CT ANGIO HEAD W OR WO CONTRAST  Addendum Date: 10/01/2019   ADDENDUM REPORT: 10/01/2019 18:55 ADDENDUM: Aortic Atherosclerosis (ICD10-I70.0) and Emphysema (ICD10-J43.9). 5 mm pulmonary nodule right upper lobe. Non-contrast chest CT can be considered in 12 months if patient is high-risk, which appears to be the case based on the emphysema. This recommendation follows the consensus statement: Guidelines for Management of Incidental Pulmonary Nodules Detected on CT Images: From the Fleischner Society 2017; Radiology 2017; 284:228-243. Electronically Signed   By: Nelson Chimes M.D.   On: 10/01/2019 18:55   Result Date:  10/01/2019 CLINICAL DATA:  Weakness and facial droop. Difficulty swallowing. Acute infarction right frontal lobe. Abnormal flow right internal carotid artery. EXAM: CT ANGIOGRAPHY HEAD AND NECK TECHNIQUE: Multidetector CT imaging of the head and neck was performed using the standard protocol during bolus administration of intravenous contrast. Multiplanar CT image reconstructions and MIPs were obtained to evaluate the vascular anatomy. Carotid stenosis measurements (when applicable) are obtained utilizing NASCET criteria, using the distal internal carotid diameter as the denominator. CONTRAST:  85m OMNIPAQUE IOHEXOL 350 MG/ML SOLN COMPARISON:  CT and MRI same day FINDINGS: Aortic atherosclerosis. Branching pattern is normal without origin stenosis. CTA NECK FINDINGS Aortic arch: Aortic atherosclerosis. Branching pattern is normal without origin stenosis. Right carotid system: Common carotid artery widely patent to the bifurcation. Occlusion of the ICA in the bulb. No reconstituted flow seen in the cervical region. Left carotid system: Common carotid artery widely patent to the bifurcation. Calcified and soft plaque at the carotid bifurcation and ICA bulb but no stenosis compared to the more distal cervical ICA diameter. Vertebral arteries: Both vertebral artery origins are widely patent. The vertebral arteries are patent through the cervical region to the foramen magnum without stenosis greater than 30%. Skeleton: Ordinary cervical spondylosis and facet arthropathy. Other neck: No mass or lymphadenopathy. Upper chest: Emphysema and pulmonary scarring. 5 mm pulmonary nodule in the right upper lobe. Review of the MIP images confirms the above findings CTA HEAD FINDINGS Anterior circulation: Right internal carotid artery does not show any flow through the skull base. Reconstituted in flow at the carotid terminus primarily on the basis of a patent anterior communicating and posterior communicating arteries. Left  internal carotid artery is patent through the siphon region without stenosis greater than 30%. No large or medium vessel occlusion seen on the left. On the right, there is a missing M2 to M3 branch serving the frontal operculum. Posterior circulation: Both vertebral arteries widely patent through the foramen magnum. Right vertebral terminates in PICA. Left vertebral artery supplies the basilar. No basilar stenosis. Posterior circulation branch vessels are patent. Distal branch vessels do show atherosclerotic irregularity. Venous sinuses: Patent and normal. Anatomic variants: None significant. Review of the MIP images confirms the above findings IMPRESSION: Right internal carotid artery occlusion in the ICA bulb. Flow to the anterior circulation on the right occurs by patent anterior and posterior communicating arteries. I do not see significant external to internal collateral flow. Apparent missing M2 to M3 branch serving the right frontal operculum infarction region. Electronically Signed: By: MNelson ChimesM.D. On: 10/01/2019 18:46   DG Chest 2 View  Result Date: 10/01/2019 CLINICAL DATA:  Hypertension CVA EXAM: CHEST - 2 VIEW COMPARISON:  None. FINDINGS: No focal  airspace disease or pleural effusion. Borderline cardiomegaly. Hazy/streaky atelectasis or scarring at the bases. No pneumothorax. IMPRESSION: Hazy/streaky atelectasis or scarring at the bases. Electronically Signed   By: Donavan Foil M.D.   On: 10/01/2019 17:01   CT HEAD WO CONTRAST  Result Date: 10/01/2019 CLINICAL DATA:  Weakness, possible stroke. EXAM: CT HEAD WITHOUT CONTRAST TECHNIQUE: Contiguous axial images were obtained from the base of the skull through the vertex without intravenous contrast. COMPARISON:  None. FINDINGS: Brain: Large right frontal low density is noted most consistent with acute infarction. No midline shift is noted. Ventricular size is within normal limits. No definite hemorrhage is noted. Vascular: No hyperdense  vessel or unexpected calcification. Skull: Normal. Negative for fracture or focal lesion. Sinuses/Orbits: No acute finding. Other: None. IMPRESSION: Large right frontal low density is noted most consistent with acute infarction. No definite hemorrhage is noted. MRI is recommended for further evaluation. Electronically Signed   By: Marijo Conception M.D.   On: 10/01/2019 13:47   CT ANGIO NECK W OR WO CONTRAST  Addendum Date: 10/01/2019   ADDENDUM REPORT: 10/01/2019 18:55 ADDENDUM: Aortic Atherosclerosis (ICD10-I70.0) and Emphysema (ICD10-J43.9). 5 mm pulmonary nodule right upper lobe. Non-contrast chest CT can be considered in 12 months if patient is high-risk, which appears to be the case based on the emphysema. This recommendation follows the consensus statement: Guidelines for Management of Incidental Pulmonary Nodules Detected on CT Images: From the Fleischner Society 2017; Radiology 2017; 284:228-243. Electronically Signed   By: Nelson Chimes M.D.   On: 10/01/2019 18:55   Result Date: 10/01/2019 CLINICAL DATA:  Weakness and facial droop. Difficulty swallowing. Acute infarction right frontal lobe. Abnormal flow right internal carotid artery. EXAM: CT ANGIOGRAPHY HEAD AND NECK TECHNIQUE: Multidetector CT imaging of the head and neck was performed using the standard protocol during bolus administration of intravenous contrast. Multiplanar CT image reconstructions and MIPs were obtained to evaluate the vascular anatomy. Carotid stenosis measurements (when applicable) are obtained utilizing NASCET criteria, using the distal internal carotid diameter as the denominator. CONTRAST:  68m OMNIPAQUE IOHEXOL 350 MG/ML SOLN COMPARISON:  CT and MRI same day FINDINGS: Aortic atherosclerosis. Branching pattern is normal without origin stenosis. CTA NECK FINDINGS Aortic arch: Aortic atherosclerosis. Branching pattern is normal without origin stenosis. Right carotid system: Common carotid artery widely patent to the  bifurcation. Occlusion of the ICA in the bulb. No reconstituted flow seen in the cervical region. Left carotid system: Common carotid artery widely patent to the bifurcation. Calcified and soft plaque at the carotid bifurcation and ICA bulb but no stenosis compared to the more distal cervical ICA diameter. Vertebral arteries: Both vertebral artery origins are widely patent. The vertebral arteries are patent through the cervical region to the foramen magnum without stenosis greater than 30%. Skeleton: Ordinary cervical spondylosis and facet arthropathy. Other neck: No mass or lymphadenopathy. Upper chest: Emphysema and pulmonary scarring. 5 mm pulmonary nodule in the right upper lobe. Review of the MIP images confirms the above findings CTA HEAD FINDINGS Anterior circulation: Right internal carotid artery does not show any flow through the skull base. Reconstituted in flow at the carotid terminus primarily on the basis of a patent anterior communicating and posterior communicating arteries. Left internal carotid artery is patent through the siphon region without stenosis greater than 30%. No large or medium vessel occlusion seen on the left. On the right, there is a missing M2 to M3 branch serving the frontal operculum. Posterior circulation: Both vertebral arteries widely patent through the  foramen magnum. Right vertebral terminates in PICA. Left vertebral artery supplies the basilar. No basilar stenosis. Posterior circulation branch vessels are patent. Distal branch vessels do show atherosclerotic irregularity. Venous sinuses: Patent and normal. Anatomic variants: None significant. Review of the MIP images confirms the above findings IMPRESSION: Right internal carotid artery occlusion in the ICA bulb. Flow to the anterior circulation on the right occurs by patent anterior and posterior communicating arteries. I do not see significant external to internal collateral flow. Apparent missing M2 to M3 branch serving the  right frontal operculum infarction region. Electronically Signed: By: Nelson Chimes M.D. On: 10/01/2019 18:46   MR BRAIN WO CONTRAST  Result Date: 10/01/2019 CLINICAL DATA:  Hypertension and diabetes. Difficulty swallowing. Weakness. Abnormal head CT right frontal region. EXAM: MRI HEAD WITHOUT CONTRAST TECHNIQUE: Multiplanar, multiecho pulse sequences of the brain and surrounding structures were obtained without intravenous contrast. COMPARISON:  Head CT same day FINDINGS: Brain: There is acute infarction in the right insula and frontal operculum with the region measuring approximately 6.6 x 6.1 x 4.0 cm. Swelling of the region of infarction but without hemorrhage. Punctate acute infarction in the medial right hypothalamus. No focal abnormality affects the brainstem or cerebellum. Cerebral hemispheres otherwise show moderate chronic small-vessel changes of the deep and subcortical white matter. There is an old cortical and subcortical infarction in the right occipital lobe. No midline shift. No hydrocephalus. No extra-axial collection. Vascular: Abnormal flow pattern in the right internal carotid artery. There could be right internal carotid artery occlusion or very slow flow. Skull and upper cervical spine: Negative Sinuses/Orbits: Mild seasonal mucosal inflammatory changes of the sinuses. Orbits negative. Other: None IMPRESSION: 6.6 x 6.1 x 4.0 cm region of acute infarction affecting the right insula and right frontal operculum consistent with right MCA branch vessel infarction. Slow flow or no flow in the right ICA at the skull base. The infarction shows mild swelling but there is no mass effect or shift. No sign of blood products within the infarction. Punctate acute infarction also demonstrated in the medial right hypothalamus. Background pattern of moderate chronic small-vessel ischemic changes affecting the cerebral hemispheric white matter. Old right occipital cortical and subcortical infarction.  Electronically Signed   By: Nelson Chimes M.D.   On: 10/01/2019 17:42   ECHOCARDIOGRAM COMPLETE  Result Date: 10/02/2019    ECHOCARDIOGRAM REPORT   Patient Name:   ARAGORN RECKER Date of Exam: 10/02/2019 Medical Rec #:  [id number]       Height:       70.0 in Accession #:    [id number]      Weight:       210.0 lb Date of Birth:  1955/07/16       BSA:          2.131 m Patient Age:    64 years        BP:           136/54 mmHg Patient Gender: M               HR:           65 bpm. Exam Location:  Inpatient Procedure: 2D Echo Indications:    TIA 435.9 / G45.9  History:        Patient has no prior history of Echocardiogram examinations. No                 prior cardiac history.  Sonographer:    Vikki Ports Turrentine Referring Phys: [id number] Judson T  ZHANG  Sonographer Comments: Poor patient compliance. IMPRESSIONS  1. Left ventricular ejection fraction, by estimation, is 60 to 65%. The left ventricle has normal function. The left ventricle has no regional wall motion abnormalities. There is mild concentric left ventricular hypertrophy. Left ventricular diastolic parameters are consistent with Grade I diastolic dysfunction (impaired relaxation).  2. Right ventricular systolic function is normal. The right ventricular size is normal. There is normal pulmonary artery systolic pressure.  3. The mitral valve is normal in structure. Trivial mitral valve regurgitation. No evidence of mitral stenosis.  4. The aortic valve is normal in structure. Aortic valve regurgitation is mild. No aortic stenosis is present.  5. The inferior vena cava is normal in size with greater than 50% respiratory variability, suggesting right atrial pressure of 3 mmHg. FINDINGS  Left Ventricle: Left ventricular ejection fraction, by estimation, is 60 to 65%. The left ventricle has normal function. The left ventricle has no regional wall motion abnormalities. The left ventricular internal cavity size was normal in size. There is  mild concentric left  ventricular hypertrophy. Left ventricular diastolic parameters are consistent with Grade I diastolic dysfunction (impaired relaxation). Normal left ventricular filling pressure. Right Ventricle: The right ventricular size is normal. No increase in right ventricular wall thickness. Right ventricular systolic function is normal. There is normal pulmonary artery systolic pressure. The tricuspid regurgitant velocity is 2.09 m/s, and  with an assumed right atrial pressure of 8 mmHg, the estimated right ventricular systolic pressure is 36.1 mmHg. Left Atrium: Left atrial size was normal in size. Right Atrium: Right atrial size was normal in size. Pericardium: There is no evidence of pericardial effusion. Mitral Valve: The mitral valve is normal in structure. Normal mobility of the mitral valve leaflets. Trivial mitral valve regurgitation. No evidence of mitral valve stenosis. Tricuspid Valve: The tricuspid valve is normal in structure. Tricuspid valve regurgitation is mild . No evidence of tricuspid stenosis. Aortic Valve: The aortic valve is normal in structure. Aortic valve regurgitation is mild. No aortic stenosis is present. Aortic valve mean gradient measures 5.0 mmHg. Aortic valve peak gradient measures 7.7 mmHg. Aortic valve area, by VTI measures 2.37 cm. Pulmonic Valve: The pulmonic valve was normal in structure. Pulmonic valve regurgitation is trivial. No evidence of pulmonic stenosis. Aorta: The aortic root is normal in size and structure. Venous: The inferior vena cava is normal in size with greater than 50% respiratory variability, suggesting right atrial pressure of 3 mmHg. IAS/Shunts: No atrial level shunt detected by color flow Doppler.  LEFT VENTRICLE PLAX 2D LVIDd:         4.60 cm  Diastology LVIDs:         3.09 cm  LV e' lateral:   9.68 cm/s LV PW:         1.11 cm  LV E/e' lateral: 7.4 LV IVS:        1.11 cm  LV e' medial:    7.72 cm/s LVOT diam:     2.00 cm  LV E/e' medial:  9.3 LV SV:         78 LV SV  Index:   36 LVOT Area:     3.14 cm  RIGHT VENTRICLE RV S prime:     11.30 cm/s TAPSE (M-mode): 2.1 cm LEFT ATRIUM             Index       RIGHT ATRIUM           Index LA diam:  3.40 cm 1.60 cm/m  RA Area:     16.30 cm LA Vol (A2C):   36.4 ml 17.08 ml/m RA Volume:   43.70 ml  20.51 ml/m LA Vol (A4C):   40.3 ml 18.91 ml/m LA Biplane Vol: 41.1 ml 19.29 ml/m  AORTIC VALVE AV Area (Vmax):    2.62 cm AV Area (Vmean):   2.37 cm AV Area (VTI):     2.37 cm AV Vmax:           139.00 cm/s AV Vmean:          105.000 cm/s AV VTI:            0.327 m AV Peak Grad:      7.7 mmHg AV Mean Grad:      5.0 mmHg LVOT Vmax:         116.00 cm/s LVOT Vmean:        79.100 cm/s LVOT VTI:          0.247 m LVOT/AV VTI ratio: 0.76  AORTA Ao Root diam: 3.50 cm MITRAL VALVE                TRICUSPID VALVE MV Area (PHT): 2.76 cm     TR Peak grad:   17.5 mmHg MV Decel Time: 275 msec     TR Vmax:        209.00 cm/s MV E velocity: 72.00 cm/s MV A velocity: 111.00 cm/s  SHUNTS MV E/A ratio:  0.65         Systemic VTI:  0.25 m                             Systemic Diam: 2.00 cm Fransico Him MD Electronically signed by Fransico Him MD Signature Date/Time: 10/02/2019/9:52:20 AM    Final    VAS US CAROTID  Result Date: 10/02/2019 Carotid Arterial Duplex Study Indications:   CVA and Carotid artery disease. Other Factors: CT showed occluded ICA. Performing Technologist: June Leap RDMS, RVT  Examination Guidelines: A complete evaluation includes B-mode imaging, spectral Doppler, color Doppler, and power Doppler as needed of all accessible portions of each vessel. Bilateral testing is considered an integral part of a complete examination. Limited examinations for reoccurring indications may be performed as noted.  Right Carotid Findings: +----------+--------+--------+--------+------------------+---------------------+           PSV cm/sEDV cm/sStenosisPlaque DescriptionComments               +----------+--------+--------+--------+------------------+---------------------+ CCA Prox  81      9                                                       +----------+--------+--------+--------+------------------+---------------------+ CCA Distal42      5                                                       +----------+--------+--------+--------+------------------+---------------------+ ICA Prox  99              Occluded                  near occlusion,  minimal flow noted                                                        proximally            +----------+--------+--------+--------+------------------+---------------------+ ECA       183     19                                                      +----------+--------+--------+--------+------------------+---------------------+ +----------+--------+-------+--------+-------------------+           PSV cm/sEDV cmsDescribeArm Pressure (mmHG) +----------+--------+-------+--------+-------------------+ [id number]            Stenotic                    +----------+--------+-------+--------+-------------------+ +---------+--------+--+--------+-+---------+ VertebralPSV cm/s74EDV cm/s5Antegrade +---------+--------+--+--------+-+---------+  Left Carotid Findings: +----------+--------+--------+--------+------------------+--------+           PSV cm/sEDV cm/sStenosisPlaque DescriptionComments +----------+--------+--------+--------+------------------+--------+ CCA Prox  96      19                                         +----------+--------+--------+--------+------------------+--------+ CCA Distal69      21                                         +----------+--------+--------+--------+------------------+--------+ ICA Prox  58      21      1-39%   heterogenous                +----------+--------+--------+--------+------------------+--------+ ICA Distal107     30                                         +----------+--------+--------+--------+------------------+--------+ ECA       169     11                                         +----------+--------+--------+--------+------------------+--------+ +----------+--------+--------+----------------+-------------------+           PSV cm/sEDV cm/sDescribe        Arm Pressure (mmHG) +----------+--------+--------+----------------+-------------------+ [id number]             Multiphasic, WNL                    +----------+--------+--------+----------------+-------------------+ +---------+--------+--+--------+--+---------+ VertebralPSV cm/s91EDV cm/s29Antegrade +---------+--------+--+--------+--+---------+   Summary: Right Carotid: Evidence consistent with a total occlusion of the right ICA. Left Carotid: Velocities in the left ICA are consistent with a 1-39% stenosis. Vertebrals:  Bilateral vertebral arteries demonstrate antegrade flow. Subclavians: Normal flow hemodynamics were seen in bilateral subclavian              arteries. *See table(s) above for measurements and observations.  Electronically signed by Antony Contras MD on 10/02/2019 at 5:22:55 PM.    Final  Microbiology: Recent Results (from the past 240 hour(s))  Respiratory Panel by RT PCR (Flu A&B, Covid) - Nasopharyngeal Swab     Status: None   Collection Time: 10/01/19  4:30 PM   Specimen: Nasopharyngeal Swab  Result Value Ref Range Status   SARS Coronavirus 2 by RT PCR NEGATIVE NEGATIVE Final    Comment: (NOTE) SARS-CoV-2 target nucleic acids are NOT DETECTED. The SARS-CoV-2 RNA is generally detectable in upper respiratoy specimens during the acute phase of infection. The lowest concentration of SARS-CoV-2 viral copies this assay can detect is 131 copies/mL. A negative result does not preclude SARS-Cov-2 infection and should not be  used as the sole basis for treatment or other patient management decisions. A negative result may occur with  improper specimen collection/handling, submission of specimen other than nasopharyngeal swab, presence of viral mutation(s) within the areas targeted by this assay, and inadequate number of viral copies (<131 copies/mL). A negative result must be combined with clinical observations, patient history, and epidemiological information. The expected result is Negative. Fact Sheet for Patients:  PinkCheek.be Fact Sheet for Healthcare Providers:  GravelBags.it This test is not yet ap proved or cleared by the [place] FDA and  has been authorized for detection and/or diagnosis of SARS-CoV-2 by FDA under an Emergency Use Authorization (EUA). This EUA will remain  in effect (meaning this test can be used) for the duration of the COVID-19 declaration under Section 564(b)(1) of the Act, 21 U.S.C. section 360bbb-3(b)(1), unless the authorization is terminated or revoked sooner.    Influenza A by PCR NEGATIVE NEGATIVE Final   Influenza B by PCR NEGATIVE NEGATIVE Final    Comment: (NOTE) The Xpert Xpress SARS-CoV-2/FLU/RSV assay is intended as an aid in  the diagnosis of influenza from Nasopharyngeal swab specimens and  should not be used as a sole basis for treatment. Nasal washings and  aspirates are unacceptable for Xpert Xpress SARS-CoV-2/FLU/RSV  testing. Fact Sheet for Patients: PinkCheek.be Fact Sheet for Healthcare Providers: GravelBags.it This test is not yet approved or cleared by the [place] FDA and  has been authorized for detection and/or diagnosis of SARS-CoV-2 by  FDA under an Emergency Use Authorization (EUA). This EUA will remain  in effect (meaning this test can be used) for the duration of the  Covid-19 declaration under Section 564(b)(1) of the  Act, 21  U.S.C. section 360bbb-3(b)(1), unless the authorization is  terminated or revoked. Performed at [facility], [place] [address] [place], [place] [address]      Labs: Basic Metabolic Panel: Recent Labs  Lab 10/01/19 1227 10/03/19 0505 10/04/19 0711  NA 135 139 137  K 4.0 3.8 3.8  CL 103 106 107  CO2 _0 GLUCOSE 245* 135* 136*  BUN _1 CREATININE 0.76 0.72 0.67  CALCIUM 9.1 8.9 8.9  MG  --  2.2  --    Liver Function Tests: Recent Labs  Lab 10/01/19 1227  AST 14*  ALT 13  ALKPHOS 66  BILITOT 1.1  PROT 7.5  ALBUMIN 4.3   No results for input(s): LIPASE, AMYLASE in the last 168 hours. No results for input(s): AMMONIA in the last 168 hours. CBC: Recent Labs  Lab 10/01/19 1227 10/03/19 0505 10/04/19 0711  WBC 11.4* 9.9 9.5  NEUTROABS 7.8* 6.1 6.1  HGB 16.6 15.7 16.1  HCT 48.6 46.3 46.0  MCV 93.1 92.2 91.3  PLT 269 253 281   Cardiac Enzymes: No results for input(s): CKTOTAL, CKMB,  CKMBINDEX, TROPONINI in the last 168 hours. BNP: BNP (last 3 results) No results for input(s): BNP in the last 8760 hours.  ProBNP (last 3 results) No results for input(s): PROBNP in the last 8760 hours.  CBG: Recent Labs  Lab 10/03/19 1334 10/03/19 1646 10/03/19 2122 10/04/19 0559 10/04/19 1127  GLUCAP 104* 99 179* 134* 118*       Signed:  Florencia Reasons MD, PhD, FACP  [facility] 10/04/2019, 2:04 PM

## 2019-10-04 NOTE — Progress Notes (Signed)
Occupational Therapy Treatment Patient Details Name: David Hendrix MRN: [id number] DOB: [birth year] Today's Date: 10/04/2019    History of present illness David Hendrix is a 64 y.o. male with history of untreated HTN and DM2,  presenting with left side weakness, slurred speech, drooling and unable to eat/drink x 3 days. He did not receive IV t-PA due to out side of time window. MRI shows 6.6 x 6.1 x 4.0 cm region of acute infarction affecting the right insula and right frontal operculum consistent with right MCA branch vessel infarction.   OT comments  Pt demonstrates cognitive deficits that will require close supervision at d/c. Pt lacks awareness to deficits. Pt able to complete shower this session with min guard supervision for sequence and balance. Pt agreeable to intervention at this time and does not have awareness to need to reschedule procedure can not just occur immediately.    Follow Up Recommendations  Outpatient OT    Equipment Recommendations  None recommended by OT    Recommendations for Other Services      Precautions / Restrictions Precautions Precautions: Fall Restrictions Weight Bearing Restrictions: No       Mobility Bed Mobility               General bed mobility comments: oob in chair  Transfers Overall transfer level: Needs assistance   Transfers: Sit to/from Stand Sit to Stand: Min guard         General transfer comment: cues for foley. pt walking away multiple times in session without awarenes to foley    Balance Overall balance assessment: Needs assistance           Standing balance-Leahy Scale: Fair                             ADL either performed or assessed with clinical judgement   ADL Overall ADL's : Needs assistance/impaired Eating/Feeding: Set up;Sitting   Grooming: Wash/dry face;Set up;Standing   Upper Body Bathing: Min guard   Lower Body Bathing: Set up   Upper Body Dressing : Min guard    Lower Body Dressing: Moderate assistance   Toilet Transfer: Min guard           Functional mobility during ADLs: Min guard General ADL Comments: pt allowed to shower this session and lunch arriving at teh end of shower. pt very pleasant and agreeable. Family present and educated on cognitive deficits.      Vision       Perception     Praxis      Cognition Arousal/Alertness: Awake/alert Behavior During Therapy: Flat affect Overall Cognitive Status: Impaired/Different from baseline Area of Impairment: Memory;Safety/judgement;Awareness                     Memory: Decreased short-term memory   Safety/Judgement: Decreased awareness of safety;Decreased awareness of deficits Awareness: Emergent   General Comments: pt verbalizes that his family is upset with him because he refused a procedure.pt reports not being fearful of procedure but does not like not being able to eat or shower. Pt reports if he can eat and shower he will have procedure.        Exercises     Shoulder Instructions       General Comments pt attempts to don socks on hands initially and then applies to feet    Pertinent Vitals/ Pain       Pain Assessment: No/denies pain  Home  Living                                          Prior Functioning/Environment              Frequency  Min 2X/week        Progress Toward Goals  OT Goals(current goals can now be found in the care plan section)  Progress towards OT goals: Progressing toward goals  Acute Rehab OT Goals Patient Stated Goal: go home; take a shower OT Goal Formulation: With patient Time For Goal Achievement: 10/16/19 Potential to Achieve Goals: Good ADL Goals Pt Will Perform Grooming: with supervision;standing Pt Will Perform Upper Body Dressing: with supervision Pt Will Perform Lower Body Dressing: with supervision Pt Will Transfer to Toilet: with supervision;ambulating;regular height toilet Pt Will  Perform Tub/Shower Transfer: Shower transfer;with supervision;ambulating Additional ADL Goal #1: pt will complete 5 step pathfinding task mod I  Plan Discharge plan remains appropriate    Co-evaluation                 AM-PAC OT "6 Clicks" Daily Activity     Outcome Measure   Help from another person eating meals?: A Little Help from another person taking care of personal grooming?: A Little Help from another person toileting, which includes using toliet, bedpan, or urinal?: A Little Help from another person bathing (including washing, rinsing, drying)?: A Little Help from another person to put on and taking off regular upper body clothing?: A Little Help from another person to put on and taking off regular lower body clothing?: A Little 6 Click Score: 18    End of Session    OT Visit Diagnosis: Unsteadiness on feet (R26.81);Muscle weakness (generalized) (M62.81)   Activity Tolerance Patient tolerated treatment well   Patient Left in chair;with call bell/phone within reach;with family/visitor present   Nurse Communication Mobility status;Precautions        Time: 1093-2355 OT Time Calculation (min): 34 min  Charges: OT General Charges $OT Visit: 1 Visit OT Treatments $Self Care/Home Management : 23-37 mins   Brynn, OTR/L  Acute Rehabilitation Services Pager: [phone] Office: [phone] .    Jeri Modena 10/04/2019, 4:41 PM

## 2019-10-04 NOTE — Progress Notes (Signed)
Referring Physician(s): [name]  Supervising Physician: [name]  Patient Status:  [facility] - In-pt  Chief Complaint: None  Subjective:  History of acute/subacute CVA (acute infarct of right insula and right frontal operculum) s/p diagnostic cerebral arteriogram revealing a near occluded right ICA at the bulb with probable string sign proximally. Patient laying in bed resting comfortably. He responds to voice and answers questions appropriately. No complaints. Right radial incision c/d/i.  Diagnostic cerebral arteriogram 10/03/2019: 1. Near occluded Rt ICA at the bulb with a probable string sign proximally. 2. RT VA ends in the PICA. 3. RT MCA fills retrogradely from the Lt VA  Via the Lt PCOM from the [place]. 4. Occluded superior division of the RT MCA.   Allergies: Patient has no known allergies.  Medications: Prior to Admission medications   Not on File     Vital Signs: BP (!) 186/83 (BP Location: Left Arm)   Pulse (!) 59   Temp 97.6 F (36.4 C) (Oral)   Resp 19   Ht 5\' 10"  (1.778 m)   Wt 210 lb (95.3 kg)   SpO2 99%   BMI 30.13 kg/m   Physical Exam Vitals and nursing note reviewed.  Constitutional:      General: He is not in acute distress.    Appearance: Normal appearance.  Pulmonary:     Effort: Pulmonary effort is normal. No respiratory distress.  Skin:    General: Skin is warm and dry.     Comments: Right radial incision soft without active bleeding or hematoma.  Neurological:     Mental Status: He is alert.     Comments: Alert, awake, and oriented x3. Speech and comprehension intact. Left facial droop. Can spontaneously move all extremities. No pronator drift. Fine motor and coordination intact and symmetric. Distal pulses (radial) 2+ bilaterally.     Imaging: CT ANGIO HEAD W OR WO CONTRAST  Addendum Date: 10/01/2019   ADDENDUM REPORT: 10/01/2019 18:55 ADDENDUM: Aortic Atherosclerosis (ICD10-I70.0) and Emphysema  (ICD10-J43.9). 5 mm pulmonary nodule right upper lobe. Non-contrast chest CT can be considered in 12 months if patient is high-risk, which appears to be the case based on the emphysema. This recommendation follows the consensus statement: Guidelines for Management of Incidental Pulmonary Nodules Detected on CT Images: From the Fleischner Society 2017; Radiology 2017; 284:228-243. Electronically Signed   By: [birth year]24[birth year]1987 M.D.   On: 10/01/2019 18:55   Result Date: 10/01/2019 CLINICAL DATA:  Weakness and facial droop. Difficulty swallowing. Acute infarction right frontal lobe. Abnormal flow right internal carotid artery. EXAM: CT ANGIOGRAPHY HEAD AND NECK TECHNIQUE: Multidetector CT imaging of the head and neck was performed using the standard protocol during bolus administration of intravenous contrast. Multiplanar CT image reconstructions and MIPs were obtained to evaluate the vascular anatomy. Carotid stenosis measurements (when applicable) are obtained utilizing NASCET criteria, using the distal internal carotid diameter as the denominator. CONTRAST:  69mL OMNIPAQUE IOHEXOL 350 MG/ML SOLN COMPARISON:  CT and MRI same day FINDINGS: Aortic atherosclerosis. Branching pattern is normal without origin stenosis. CTA NECK FINDINGS Aortic arch: Aortic atherosclerosis. Branching pattern is normal without origin stenosis. Right carotid system: Common carotid artery widely patent to the bifurcation. Occlusion of the ICA in the bulb. No reconstituted flow seen in the cervical region. Left carotid system: Common carotid artery widely patent to the bifurcation. Calcified and soft plaque at the carotid bifurcation and ICA bulb but no stenosis compared to the more distal cervical ICA diameter. Vertebral arteries:  Both vertebral artery origins are widely patent. The vertebral arteries are patent through the cervical region to the foramen magnum without stenosis greater than 30%. Skeleton: Ordinary cervical spondylosis and facet  arthropathy. Other neck: No mass or lymphadenopathy. Upper chest: Emphysema and pulmonary scarring. 5 mm pulmonary nodule in the right upper lobe. Review of the MIP images confirms the above findings CTA HEAD FINDINGS Anterior circulation: Right internal carotid artery does not show any flow through the skull base. Reconstituted in flow at the carotid terminus primarily on the basis of a patent anterior communicating and posterior communicating arteries. Left internal carotid artery is patent through the siphon region without stenosis greater than 30%. No large or medium vessel occlusion seen on the left. On the right, there is a missing M2 to M3 branch serving the frontal operculum. Posterior circulation: Both vertebral arteries widely patent through the foramen magnum. Right vertebral terminates in PICA. Left vertebral artery supplies the basilar. No basilar stenosis. Posterior circulation branch vessels are patent. Distal branch vessels do show atherosclerotic irregularity. Venous sinuses: Patent and normal. Anatomic variants: None significant. Review of the MIP images confirms the above findings IMPRESSION: Right internal carotid artery occlusion in the ICA bulb. Flow to the anterior circulation on the right occurs by patent anterior and posterior communicating arteries. I do not see significant external to internal collateral flow. Apparent missing M2 to M3 branch serving the right frontal operculum infarction region. Electronically Signed: By: Paulina Fusi M.D. On: 10/01/2019 18:46   DG Chest 2 View  Result Date: 10/01/2019 CLINICAL DATA:  Hypertension CVA EXAM: CHEST - 2 VIEW COMPARISON:  None. FINDINGS: No focal airspace disease or pleural effusion. Borderline cardiomegaly. Hazy/streaky atelectasis or scarring at the bases. No pneumothorax. IMPRESSION: Hazy/streaky atelectasis or scarring at the bases. Electronically Signed   By: Jasmine Pang M.D.   On: 10/01/2019 17:01   CT HEAD WO CONTRAST  Result  Date: 10/01/2019 CLINICAL DATA:  Weakness, possible stroke. EXAM: CT HEAD WITHOUT CONTRAST TECHNIQUE: Contiguous axial images were obtained from the base of the skull through the vertex without intravenous contrast. COMPARISON:  None. FINDINGS: Brain: Large right frontal low density is noted most consistent with acute infarction. No midline shift is noted. Ventricular size is within normal limits. No definite hemorrhage is noted. Vascular: No hyperdense vessel or unexpected calcification. Skull: Normal. Negative for fracture or focal lesion. Sinuses/Orbits: No acute finding. Other: None. IMPRESSION: Large right frontal low density is noted most consistent with acute infarction. No definite hemorrhage is noted. MRI is recommended for further evaluation. Electronically Signed   By: Lupita Raider M.D.   On: 10/01/2019 13:47   CT ANGIO NECK W OR WO CONTRAST  Addendum Date: 10/01/2019   ADDENDUM REPORT: 10/01/2019 18:55 ADDENDUM: Aortic Atherosclerosis (ICD10-I70.0) and Emphysema (ICD10-J43.9). 5 mm pulmonary nodule right upper lobe. Non-contrast chest CT can be considered in 12 months if patient is high-risk, which appears to be the case based on the emphysema. This recommendation follows the consensus statement: Guidelines for Management of Incidental Pulmonary Nodules Detected on CT Images: From the Fleischner Society 2017; Radiology 2017; 284:228-243. Electronically Signed   By: Paulina Fusi M.D.   On: 10/01/2019 18:55   Result Date: 10/01/2019 CLINICAL DATA:  Weakness and facial droop. Difficulty swallowing. Acute infarction right frontal lobe. Abnormal flow right internal carotid artery. EXAM: CT ANGIOGRAPHY HEAD AND NECK TECHNIQUE: Multidetector CT imaging of the head and neck was performed using the standard protocol during bolus administration of intravenous contrast.  Multiplanar CT image reconstructions and MIPs were obtained to evaluate the vascular anatomy. Carotid stenosis measurements (when  applicable) are obtained utilizing NASCET criteria, using the distal internal carotid diameter as the denominator. CONTRAST:  75mL OMNIPAQUE IOHEXOL 350 MG/ML SOLN COMPARISON:  CT and MRI same day FINDINGS: Aortic atherosclerosis. Branching pattern is normal without origin stenosis. CTA NECK FINDINGS Aortic arch: Aortic atherosclerosis. Branching pattern is normal without origin stenosis. Right carotid system: Common carotid artery widely patent to the bifurcation. Occlusion of the ICA in the bulb. No reconstituted flow seen in the cervical region. Left carotid system: Common carotid artery widely patent to the bifurcation. Calcified and soft plaque at the carotid bifurcation and ICA bulb but no stenosis compared to the more distal cervical ICA diameter. Vertebral arteries: Both vertebral artery origins are widely patent. The vertebral arteries are patent through the cervical region to the foramen magnum without stenosis greater than 30%. Skeleton: Ordinary cervical spondylosis and facet arthropathy. Other neck: No mass or lymphadenopathy. Upper chest: Emphysema and pulmonary scarring. 5 mm pulmonary nodule in the right upper lobe. Review of the MIP images confirms the above findings CTA HEAD FINDINGS Anterior circulation: Right internal carotid artery does not show any flow through the skull base. Reconstituted in flow at the carotid terminus primarily on the basis of a patent anterior communicating and posterior communicating arteries. Left internal carotid artery is patent through the siphon region without stenosis greater than 30%. No large or medium vessel occlusion seen on the left. On the right, there is a missing M2 to M3 branch serving the frontal operculum. Posterior circulation: Both vertebral arteries widely patent through the foramen magnum. Right vertebral terminates in PICA. Left vertebral artery supplies the basilar. No basilar stenosis. Posterior circulation branch vessels are patent. Distal branch  vessels do show atherosclerotic irregularity. Venous sinuses: Patent and normal. Anatomic variants: None significant. Review of the MIP images confirms the above findings IMPRESSION: Right internal carotid artery occlusion in the ICA bulb. Flow to the anterior circulation on the right occurs by patent anterior and posterior communicating arteries. I do not see significant external to internal collateral flow. Apparent missing M2 to M3 branch serving the right frontal operculum infarction region. Electronically Signed: By: Paulina Fusi M.D. On: 10/01/2019 18:46   MR BRAIN WO CONTRAST  Result Date: 10/01/2019 CLINICAL DATA:  Hypertension and diabetes. Difficulty swallowing. Weakness. Abnormal head CT right frontal region. EXAM: MRI HEAD WITHOUT CONTRAST TECHNIQUE: Multiplanar, multiecho pulse sequences of the brain and surrounding structures were obtained without intravenous contrast. COMPARISON:  Head CT same day FINDINGS: Brain: There is acute infarction in the right insula and frontal operculum with the region measuring approximately 6.6 x 6.1 x 4.0 cm. Swelling of the region of infarction but without hemorrhage. Punctate acute infarction in the medial right hypothalamus. No focal abnormality affects the brainstem or cerebellum. Cerebral hemispheres otherwise show moderate chronic small-vessel changes of the deep and subcortical white matter. There is an old cortical and subcortical infarction in the right occipital lobe. No midline shift. No hydrocephalus. No extra-axial collection. Vascular: Abnormal flow pattern in the right internal carotid artery. There could be right internal carotid artery occlusion or very slow flow. Skull and upper cervical spine: Negative Sinuses/Orbits: Mild seasonal mucosal inflammatory changes of the sinuses. Orbits negative. Other: None IMPRESSION: 6.6 x 6.1 x 4.0 cm region of acute infarction affecting the right insula and right frontal operculum consistent with right MCA branch  vessel infarction. Slow flow or no flow in  the right ICA at the skull base. The infarction shows mild swelling but there is no mass effect or shift. No sign of blood products within the infarction. Punctate acute infarction also demonstrated in the medial right hypothalamus. Background pattern of moderate chronic small-vessel ischemic changes affecting the cerebral hemispheric white matter. Old right occipital cortical and subcortical infarction. Electronically Signed   By: Paulina Fusi M.D.   On: 10/01/2019 17:42   ECHOCARDIOGRAM COMPLETE  Result Date: 10/02/2019    ECHOCARDIOGRAM REPORT   Patient Name:   ARIES TOWNLEY Date of Exam: 10/02/2019 Medical Rec #:  [id number]       Height:       70.0 in Accession #:    [id number]      Weight:       210.0 lb Date of Birth:  02-Aug-1955       BSA:          2.131 m Patient Age:    63 years        BP:           136/54 mmHg Patient Gender: M               HR:           65 bpm. Exam Location:  Inpatient Procedure: 2D Echo Indications:    TIA 435.9 / G45.9  History:        Patient has no prior history of Echocardiogram examinations. No                 prior cardiac history.  Sonographer:    Leeroy Bock Turrentine Referring Phys: [id number] [place] General  Sonographer Comments: Poor patient compliance. IMPRESSIONS  1. Left ventricular ejection fraction, by estimation, is 60 to 65%. The left ventricle has normal function. The left ventricle has no regional wall motion abnormalities. There is mild concentric left ventricular hypertrophy. Left ventricular diastolic parameters are consistent with Grade I diastolic dysfunction (impaired relaxation).  2. Right ventricular systolic function is normal. The right ventricular size is normal. There is normal pulmonary artery systolic pressure.  3. The mitral valve is normal in structure. Trivial mitral valve regurgitation. No evidence of mitral stenosis.  4. The aortic valve is normal in structure. Aortic valve regurgitation is mild. No aortic  stenosis is present.  5. The inferior vena cava is normal in size with greater than 50% respiratory variability, suggesting right atrial pressure of 3 mmHg. FINDINGS  Left Ventricle: Left ventricular ejection fraction, by estimation, is 60 to 65%. The left ventricle has normal function. The left ventricle has no regional wall motion abnormalities. The left ventricular internal cavity size was normal in size. There is  mild concentric left ventricular hypertrophy. Left ventricular diastolic parameters are consistent with Grade I diastolic dysfunction (impaired relaxation). Normal left ventricular filling pressure. Right Ventricle: The right ventricular size is normal. No increase in right ventricular wall thickness. Right ventricular systolic function is normal. There is normal pulmonary artery systolic pressure. The tricuspid regurgitant velocity is 2.09 m/s, and  with an assumed right atrial pressure of 8 mmHg, the estimated right ventricular systolic pressure is 25.5 mmHg. Left Atrium: Left atrial size was normal in size. Right Atrium: Right atrial size was normal in size. Pericardium: There is no evidence of pericardial effusion. Mitral Valve: The mitral valve is normal in structure. Normal mobility of the mitral valve leaflets. Trivial mitral valve regurgitation. No evidence of mitral valve stenosis. Tricuspid Valve: The tricuspid valve is normal in structure.  Tricuspid valve regurgitation is mild . No evidence of tricuspid stenosis. Aortic Valve: The aortic valve is normal in structure. Aortic valve regurgitation is mild. No aortic stenosis is present. Aortic valve mean gradient measures 5.0 mmHg. Aortic valve peak gradient measures 7.7 mmHg. Aortic valve area, by VTI measures 2.37 cm. Pulmonic Valve: The pulmonic valve was normal in structure. Pulmonic valve regurgitation is trivial. No evidence of pulmonic stenosis. Aorta: The aortic root is normal in size and structure. Venous: The inferior vena cava is  normal in size with greater than 50% respiratory variability, suggesting right atrial pressure of 3 mmHg. IAS/Shunts: No atrial level shunt detected by color flow Doppler.  LEFT VENTRICLE PLAX 2D LVIDd:         4.60 cm  Diastology LVIDs:         3.09 cm  LV e' lateral:   9.68 cm/s LV PW:         1.11 cm  LV E/e' lateral: 7.4 LV IVS:        1.11 cm  LV e' medial:    7.72 cm/s LVOT diam:     2.00 cm  LV E/e' medial:  9.3 LV SV:         78 LV SV Index:   36 LVOT Area:     3.14 cm  RIGHT VENTRICLE RV S prime:     11.30 cm/s TAPSE (M-mode): 2.1 cm LEFT ATRIUM             Index       RIGHT ATRIUM           Index LA diam:        3.40 cm 1.60 cm/m  RA Area:     16.30 cm LA Vol (A2C):   36.4 ml 17.08 ml/m RA Volume:   43.70 ml  20.51 ml/m LA Vol (A4C):   40.3 ml 18.91 ml/m LA Biplane Vol: 41.1 ml 19.29 ml/m  AORTIC VALVE AV Area (Vmax):    2.62 cm AV Area (Vmean):   2.37 cm AV Area (VTI):     2.37 cm AV Vmax:           139.00 cm/s AV Vmean:          105.000 cm/s AV VTI:            0.327 m AV Peak Grad:      7.7 mmHg AV Mean Grad:      5.0 mmHg LVOT Vmax:         116.00 cm/s LVOT Vmean:        79.100 cm/s LVOT VTI:          0.247 m LVOT/AV VTI ratio: 0.76  AORTA Ao Root diam: 3.50 cm MITRAL VALVE                TRICUSPID VALVE MV Area (PHT): 2.76 cm     TR Peak grad:   17.5 mmHg MV Decel Time: 275 msec     TR Vmax:        209.00 cm/s MV E velocity: 72.00 cm/s MV A velocity: 111.00 cm/s  SHUNTS MV E/A ratio:  0.65         Systemic VTI:  0.25 m                             Systemic Diam: 2.00 cm Fransico Him MD Electronically signed by Fransico Him MD Signature Date/Time: 10/02/2019/9:52:20 AM  Final    VAS US CAROTID  Result Date: 10/02/2019 Carotid Arterial Duplex Study Indications:   CVA and Carotid artery disease. Other Factors: CT showed occluded ICA. Performing Technologist: Jeb Levering RDMS, RVT  Examination Guidelines: A complete evaluation includes B-mode imaging, spectral Doppler, color Doppler, and  power Doppler as needed of all accessible portions of each vessel. Bilateral testing is considered an integral part of a complete examination. Limited examinations for reoccurring indications may be performed as noted.  Right Carotid Findings: +----------+--------+--------+--------+------------------+---------------------+           PSV cm/sEDV cm/sStenosisPlaque DescriptionComments              +----------+--------+--------+--------+------------------+---------------------+ CCA Prox  81      9                                                       +----------+--------+--------+--------+------------------+---------------------+ CCA Distal42      5                                                       +----------+--------+--------+--------+------------------+---------------------+ ICA Prox  99              Occluded                  near occlusion,                                                           minimal flow noted                                                        proximally            +----------+--------+--------+--------+------------------+---------------------+ ECA       183     19                                                      +----------+--------+--------+--------+------------------+---------------------+ +----------+--------+-------+--------+-------------------+           PSV cm/sEDV cmsDescribeArm Pressure (mmHG) +----------+--------+-------+--------+-------------------+ [id number]            Stenotic                    +----------+--------+-------+--------+-------------------+ +---------+--------+--+--------+-+---------+ VertebralPSV cm/s74EDV cm/s5Antegrade +---------+--------+--+--------+-+---------+  Left Carotid Findings: +----------+--------+--------+--------+------------------+--------+           PSV cm/sEDV cm/sStenosisPlaque DescriptionComments  +----------+--------+--------+--------+------------------+--------+ CCA Prox  96      19                                         +----------+--------+--------+--------+------------------+--------+  CCA Distal69      21                                         +----------+--------+--------+--------+------------------+--------+ ICA Prox  58      21      1-39%   heterogenous               +----------+--------+--------+--------+------------------+--------+ ICA Distal107     30                                         +----------+--------+--------+--------+------------------+--------+ ECA       169     11                                         +----------+--------+--------+--------+------------------+--------+ +----------+--------+--------+----------------+-------------------+           PSV cm/sEDV cm/sDescribe        Arm Pressure (mmHG) +----------+--------+--------+----------------+-------------------+ [id number]             Multiphasic, WNL                    +----------+--------+--------+----------------+-------------------+ +---------+--------+--+--------+--+---------+ VertebralPSV cm/s91EDV cm/s29Antegrade +---------+--------+--+--------+--+---------+   Summary: Right Carotid: Evidence consistent with a total occlusion of the right ICA. Left Carotid: Velocities in the left ICA are consistent with a 1-39% stenosis. Vertebrals:  Bilateral vertebral arteries demonstrate antegrade flow. Subclavians: Normal flow hemodynamics were seen in bilateral subclavian              arteries. *See table(s) above for measurements and observations.  Electronically signed by Delia HeadyPramod Sethi MD on 10/02/2019 at 5:22:55 PM.    Final     Labs:  CBC: Recent Labs    10/01/19 1227 10/03/19 0505 10/04/19 0711  WBC 11.4* 9.9 9.5  HGB 16.6 15.7 16.1  HCT 48.6 46.3 46.0  PLT 269 253 281    COAGS: Recent Labs    10/01/19 1227  INR 1.0  APTT 29    BMP: Recent Labs     10/01/19 1227 10/03/19 0505 10/04/19 0711  NA 135 139 137  K 4.0 3.8 3.8  CL 103 106 107  CO2 23 24 22   GLUCOSE 245* 135* 136*  BUN 12 8 11   CALCIUM 9.1 8.9 8.9  CREATININE 0.76 0.72 0.67  GFRNONAA >60 >60 >60  GFRAA >60 >60 >60    LIVER FUNCTION TESTS: Recent Labs    10/01/19 1227  BILITOT 1.1  AST 14*  ALT 13  ALKPHOS 66  PROT 7.5  ALBUMIN 4.3    Assessment and Plan:  History of acute/subacute CVA (acute infarct of right insula and right frontal operculum) s/p diagnostic cerebral arteriogram revealing a near occluded right ICA at the bulb with probable string sign proximally. Discussed revascularization procedure with patient, including risks and benefits. Options were presented with patient regarding timing, including proceeding with procedure today versus scheduling patient for procedure tentatively for next week on an outpatient basis. At this time, patient wishes to wait for procedure to be scheduled on an outpatient basis. Message sent to schedulers to facilitate this- they will call him to set up this procedure. Dr. Pearlean BrownieSethi made aware. Right radial incision stable, bilateral radial pulses 2+.  P2Y12 137 PRU this AM. Per Dr. Corliss Skains, administer additional Plavix 150 mg and Aspirin 325 mg today. Patient to be discharged home with Plavix 75 mg once daily and Aspirin 325 mg once daily per Dr. Corliss Skains. Further plans per [facility]/neurology- appreciate and agree with management. Please call [facility] with questions/concerns.   Electronically Signed: Elwin Mocha, PA-C 10/04/2019, 9:43 AM   I spent a total of 25 Minutes at the the patient's bedside AND on the patient's hospital floor or unit, greater than 50% of which was counseling/coordinating care for right ICA severe stenosis.

## 2019-10-04 NOTE — Anesthesia Preprocedure Evaluation (Deleted)
Anesthesia Evaluation    Reviewed: Allergy & Precautions, Patient's Chart, lab work & pertinent test results  Airway        Dental   Pulmonary neg pulmonary ROS,           Cardiovascular hypertension, +CHF  + Valvular Problems/Murmurs (mild AI) AI   Echo 10/02/2019: 1. Left ventricular ejection fraction, by estimation, is 60 to 65%. The  left ventricle has normal function. The left ventricle has no regional  wall motion abnormalities. There is mild concentric left ventricular  hypertrophy. Left ventricular diastolic  parameters are consistent with Grade I diastolic dysfunction (impaired  relaxation).  2. Right ventricular systolic function is normal. The right ventricular  size is normal. There is normal pulmonary artery systolic pressure.  3. The mitral valve is normal in structure. Trivial mitral valve  regurgitation. No evidence of mitral stenosis.  4. The aortic valve is normal in structure. Aortic valve regurgitation is  mild. No aortic stenosis is present.  5. The inferior vena cava is normal in size with greater than 50%  respiratory variability, suggesting right atrial pressure of 3 mmHg.    Neuro/Psych    GI/Hepatic negative GI ROS, Neg liver ROS,   Endo/Other  diabetesObesity BMI 30  Renal/GU negative Renal ROS     Musculoskeletal negative musculoskeletal ROS (+)   Abdominal   Peds  Hematology negative hematology ROS (+)   Anesthesia Other Findings Day of surgery medications reviewed with the patient.  Reproductive/Obstetrics                             Anesthesia Physical Anesthesia Plan Anesthesia Quick Evaluation

## 2019-10-04 NOTE — Progress Notes (Signed)
Physical Therapy Treatment Patient Details Name: Henley Boettner MRN: [id number] DOB: [birth year] Today's Date: 10/04/2019    History of Present Illness Mr. Trystan Akhtar is a 64 y.o. male with history of untreated HTN and DM2,  presenting with left side weakness, slurred speech, drooling and unable to eat/drink x 3 days. He did not receive IV t-PA due to out side of time window. MRI shows 6.6 x 6.1 x 4.0 cm region of acute infarction affecting the right insula and right frontal operculum consistent with right MCA branch vessel infarction.    PT Comments    Patient seen for mobiltiy progression. Pt requires min guard for safety with OOB mobility. Pt presents with cognitive deficits and decreased awareness of safety. Recommend HHPT for skilled PT services to maximize independence and safety with mobility and 24 hour supervision/assist. PT will continue to follow acutely.    Follow Up Recommendations  Home health PT;Supervision/Assistance - 24 hour     Equipment Recommendations  None recommended by PT    Recommendations for Other Services       Precautions / Restrictions Precautions Precautions: Fall Restrictions Weight Bearing Restrictions: No    Mobility  Bed Mobility Overal bed mobility: Needs Assistance Bed Mobility: Supine to Sit     Supine to sit: Min assist     General bed mobility comments: pt reaching for assistance without attempting first; pt encouraged to try with use of rail if needed; son assisted to elevate trunk into sitting   Transfers Overall transfer level: Needs assistance   Transfers: Sit to/from Stand Sit to Stand: Min guard         General transfer comment: pt requested to put pants on to ambulate outside of room; pt almost stood with L pant leg about half way up and standing on pants; cues for safety and to complete donning pants before moving on to ambulation; min guard for safety to stand; assist needed to pull up  pants  Ambulation/Gait Ambulation/Gait assistance: Min guard Gait Distance (Feet): 250 Feet Assistive device: None Gait Pattern/deviations: Step-through pattern;Drifts right/left     General Gait Details: cues to safely navigate distracting environment   Stairs Stairs: Yes Stairs assistance: Min guard Stair Management: Two rails;Alternating pattern Number of Stairs: 2 General stair comments: unsteady when descending; cues to slow down/for safety   Wheelchair Mobility    Modified Rankin (Stroke Patients Only)       Balance Overall balance assessment: Needs assistance   Sitting balance-Leahy Scale: Good       Standing balance-Leahy Scale: Fair                              Cognition Arousal/Alertness: Awake/alert Behavior During Therapy: Flat affect Overall Cognitive Status: Impaired/Different from baseline Area of Impairment: Memory;Safety/judgement;Awareness                     Memory: Decreased short-term memory   Safety/Judgement: Decreased awareness of safety;Decreased awareness of deficits Awareness: Emergent   General Comments: pt easily distracted in hallway       Exercises      General Comments General comments (skin integrity, edema, etc.): pt attempts to don socks on hands initially and then applies to feet      Pertinent Vitals/Pain Pain Assessment: No/denies pain    Home Living                      Prior  Function            PT Goals (current goals can now be found in the care plan section) Acute Rehab PT Goals Patient Stated Goal: go home; take a shower Progress towards PT goals: Progressing toward goals    Frequency    Min 4X/week      PT Plan Discharge plan needs to be updated    Co-evaluation              AM-PAC PT "6 Clicks" Mobility   Outcome Measure  Help needed turning from your back to your side while in a flat bed without using bedrails?: None Help needed moving from lying on  your back to sitting on the side of a flat bed without using bedrails?: None Help needed moving to and from a bed to a chair (including a wheelchair)?: None Help needed standing up from a chair using your arms (e.g., wheelchair or bedside chair)?: None Help needed to walk in hospital room?: A Little Help needed climbing 3-5 steps with a railing? : A Little 6 Click Score: 22    End of Session Equipment Utilized During Treatment: Gait belt Activity Tolerance: Patient tolerated treatment well Patient left: in chair;with call bell/phone within reach;with family/visitor present Nurse Communication: Mobility status PT Visit Diagnosis: Unsteadiness on feet (R26.81)     Time: 1330-1400 PT Time Calculation (min) (ACUTE ONLY): 30 min  Charges:  $Gait Training: 23-37 mins                     Earney Navy, PTA Acute Rehabilitation Services Pager: [phone] Office: [phone]     Darliss Cheney 10/04/2019, 4:59 PM

## 2019-10-04 NOTE — TOC Transition Note (Signed)
Transition of Care [facility]) - CM/SW Discharge Note   Patient Details  Name: David Hendrix MRN: [id number] Date of Birth: 04/18/1956  [facility]) CM/SW Contact:  Kermit Balo, RN Phone Number: 10/04/2019, 3:38 PM   Clinical Narrative:    Pt discharging home with [facility] services through [place]. [name] with [facility] is aware of d/c home today.  Pt's discharge medications were delivered to the room per [facility] pharmacy.  Pt has supervision at home and transportation home.    Final next level of care: Home w Home Health Services Barriers to Discharge: No Barriers Identified   Patient Goals and CMS Choice   CMS Medicare.gov Compare Post Acute Care list provided to:: Patient Represenative (must comment) Choice offered to / list presented to : Patient, Adult Children  Discharge Placement                       Discharge Plan and Services   Discharge Planning Services: CM Consult Post Acute Care Choice: Home Health                    HH Arranged: PT, Speech Therapy, OT HH Agency: [facility] Date [facility] Agency Contacted: 10/03/19   Representative spoke with at [facility] Agency: Kandee Keen  Social Determinants of Health (SDOH) Interventions     Readmission Risk Interventions No flowsheet data found.

## 2019-10-04 NOTE — Plan of Care (Signed)
Adequate for discharge.

## 2019-10-04 NOTE — TOC Initial Note (Addendum)
Transition of Care [facility]) - Initial/Assessment Note    Patient Details  Name: David Hendrix MRN: [id number] Date of Birth: 08-01-1955  Transition of Care [facility]) CM/SW Contact:    [name], RN Phone Number: 10/04/2019, 8:22 AM  Clinical Narrative:                 CM met with the patient and his daughter yesterday to go over that pt is doing too well for CIR and recommendations are for [facility] services. Daughter and pt in agreement. Choice for [facility] provided and [facility] decided on. [name] with [name] accepted referral.  Pt without a PCP but daughter is going to get him an appt at [facility] primary care with [name]. [name] will cover for [facility] orders in the interim. [name] aware.  TOC following for further d/c needs.   Daughters address: [address] in [place] [facility]  Expected Discharge Plan: [place] Services Barriers to Discharge: Continued Medical Work up   Patient Goals and CMS Choice   CMS Medicare.gov Compare Post Acute Care list provided to:: Patient Represenative (must comment) Choice offered to / list presented to : Patient, Adult Children  Expected Discharge Plan and Services Expected Discharge Plan: [place]   Discharge Planning Services: CM Consult Post Acute Care Choice: [place] arrangements for the past 2 months: Single Family Home                           HH Arranged: PT, OT HH Agency: [place] Date [name]: 10/03/19   Representative spoke with at [place]: [name]  Prior Living Arrangements/Services Living arrangements for the past 2 months: [place] Lives with:: Self Patient language and need for interpreter reviewed:: Yes Do you feel safe going back to the place where you live?: Yes      Need for Family Participation in Patient Care: Yes (Comment) Care giver support system in place?: Yes (comment)(Pt is going to stay at daughters home.)   Criminal  Activity/Legal Involvement Pertinent to Current Situation/Hospitalization: No - Comment as needed  Activities of Daily Living      Permission Sought/Granted                  Emotional Assessment Appearance:: Appears stated age   Affect (typically observed): Accepting Orientation: : Oriented to Self, Oriented to Place, Oriented to Situation   Psych Involvement: No (comment)  Admission diagnosis:  CVA (cerebral vascular accident) ([place]) [I63.9] Cerebrovascular accident (CVA), unspecified mechanism ([place]) [I63.9] Patient Active Problem List   Diagnosis Date Noted  . Essential hypertension   . Controlled type 2 diabetes mellitus with hyperglycemia, without long-term current use of insulin ([place])   . Noncompliance   . CVA (cerebral vascular accident) ([place]) 10/01/2019   PCP:  Patient, No Pcp Per Pharmacy:   [id number] [place] [place] [place] [address]Phone: [phone]: [phone]    Social Determinants of Health (SDOH) Interventions    Readmission Risk Interventions No flowsheet data found.

## 2019-10-04 NOTE — Progress Notes (Signed)
TR Band removed

## 2019-10-04 NOTE — Progress Notes (Signed)
OT Cancellation Note  Patient Details Name: David Hendrix MRN: [id number] DOB: 10/05/1955   Cancelled Treatment:    Reason Eval/Treat Not Completed: Patient at procedure or test/ unavailable  Wynona Neat, OTR/L  Acute Rehabilitation Services Pager: [phone] Office: [phone] .  10/04/2019, 8:22 AM

## 2019-10-04 NOTE — Progress Notes (Signed)
STROKE TEAM PROGRESS NOTE   INTERVAL HISTORY Patient informs me that he does not want to stay in the hospital and get the carotid angioplasty stenting today but wants to go home and is willing to come back next week.  I counseled him that he is at high risk for carotid occlusion and recurrent strokes and neurological worsening but he did not agree for the procedure today.  No neurological changes. Diagnostic cerebral catheter angiogram yesterday showed near occlusion of right carotid artery with string sign Vitals:   10/04/19 0339 10/04/19 0443 10/04/19 0906 10/04/19 1125  BP: (!) 153/71 (!) 177/96 (!) 186/83 (!) 175/68  Pulse: 62 71 (!) 59 (!) 58  Resp: 19 17 19 17   Temp: 98.5 F (36.9 C) 97.8 F (36.6 C) 97.6 F (36.4 C) (!) 97.3 F (36.3 C)  TempSrc: Oral Oral Oral Oral  SpO2: 99% 96% 99% 99%  Weight:      Height:        CBC:  Recent Labs  Lab 10/03/19 0505 10/04/19 0711  WBC 9.9 9.5  NEUTROABS 6.1 6.1  HGB 15.7 16.1  HCT 46.3 46.0  MCV 92.2 91.3  PLT 253 093    Basic Metabolic Panel:  Recent Labs  Lab 10/03/19 0505 10/04/19 0711  NA 139 137  K 3.8 3.8  CL 106 107  CO2 24 22  GLUCOSE 135* 136*  BUN 8 11  CREATININE 0.72 0.67  CALCIUM 8.9 8.9  MG 2.2  --    Lipid Panel:     Component Value Date/Time   CHOL 199 10/02/2019 0428   TRIG 226 (H) 10/02/2019 0428   HDL 22 (L) 10/02/2019 0428   CHOLHDL 9.0 10/02/2019 0428   VLDL 45 (H) 10/02/2019 0428   LDLCALC 132 (H) 10/02/2019 0428   HgbA1c:  Lab Results  Component Value Date   HGBA1C 7.6 (H) 10/02/2019   Urine Drug Screen: No results found for: LABOPIA, COCAINSCRNUR, LABBENZ, AMPHETMU, THCU, LABBARB  Alcohol Level No results found for: ETH  IMAGING past 24 hours No results found.  PHYSICAL EXAM GEN: Obese middle-aged Caucasian male tearful, but no acute distress HENT: Positive fordrooling.  Respiratory: Negative forcoughand shortness of breath.  Cardiovascular: No murmur or gallop.  Marland Kitchen   Neurological: A&Ox4, speech is dysarthric. No anomia. No aphasia, PERRL, EOMI, dense left facial droop. Tongue is not midline.shrug is weak on left. 4/5 grip on left w/drift. 4/5 foot push w/mild drift on left lrg. Rt wnl. FTN, HTS wnl Did not test gait  ASSESSMENT/PLAN [name] is a 64 y.o. male with history of DM2 and HTN presenting with left side weakness, slurred speech, drooling and unable to eat/drink x 3 days. He did not receive IV t-PA due to out side of time window. No thrombectomy given 3 day time line and competed stroke.   Stroke:  Embolic appearing right frontal stroke d/t right carotid near occlusion in neck with string sign CTA head & neck  ICA occlusion-likely acute. IR consulted  MRI  Large right frontal stroke  2D Echo normal ejection fraction 60 to 65%. No cardiac source of embolism LDL 132  HgbA1c 7.6  lovenox for VTE prophylaxis    Diet   Diet NPO time specified    none prior to admission, now on ASA, will add Plavix pending vascular consult (incase surgical procedure is needed)  Therapy recommendations: pending  Disposition:  pending  Hypertension  Home meds:  none  Stable . Permissive hypertension not indicated .  Long-term BP goal normotensive  Hyperlipidemia  Home meds:  none  LDL 132, goal < 70  Add 40mg  Lipitor po daily  Continue statin at discharge  Diabetes type II Uncontrolled  Home meds:  none  HgbA1c 7.6, goal < 7.0  CBGs Recent Labs    10/03/19 2122 10/04/19 0559 10/04/19 1127  GLUCAP 179* 134* 118*      SSI  Other Stroke Risk Factors  Cigarette smoker; advised to stop smoking  ongestive heart failure  Hospital day # 3    He presented with dysarthria and left facial droop secondary to embolic right MCA branch infarct from right carotid near occlusion with a string sign.   Patient would benefit with emergent revascularization with angioplasty stenting which had been arranged for today by Dr.  10/06/19 but the patient has changes 9 and is no longer willing to stay in the hospital but instead says he may come back next week for elective procedure.  Despite me counseling him against doing this as he was at high risk for carotid complete occlusion and recurrent stroke and worsening of deficits is unwilling to change his mind.  Recommend aspirin and Plavix into 3 months and aggressive risk factor modification.  Return for elective right carotid revascularization next week with Dr. Corliss Hendrix.  Patient counseled to quit smoking and is agreeable.    Discussed with Dr. Corliss Hendrix and Dr. Parke Hendrix  .  Greater than 50% time during this 25-minute visit was spent on counseling and coordination of care about his carotid occlusion and stroke and answering questions.  Follow-up as an outpatient stroke clinic in 6 weeks.  Stroke team will sign off.  Kindly call for questions  David Skains, MD Medical Director [facility] Pager: [phone] 10/04/2019 12:48 PM  To contact Stroke Continuity provider, please refer to 10/06/2019. After hours, contact General Neurology

## 2019-10-04 NOTE — Progress Notes (Signed)
SLP Cancellation Note  Patient Details Name: David Hendrix MRN: [id number] DOB: 05-29-1956   Cancelled treatment:       Reason Eval/Treat Not Completed: Patient at procedure or test/unavailable   Verenis [place], Riley Nearing 10/04/2019, 10:55 AM

## 2019-10-05 ENCOUNTER — Encounter (HOSPITAL_COMMUNITY): Payer: Self-pay | Admitting: Emergency Medicine

## 2019-10-05 ENCOUNTER — Other Ambulatory Visit: Payer: Self-pay

## 2019-10-05 LAB — COMPREHENSIVE METABOLIC PANEL
ALT: 17 U/L (ref 0–44)
AST: 15 U/L (ref 15–41)
Albumin: 4 g/dL (ref 3.5–5.0)
Alkaline Phosphatase: 71 U/L (ref 38–126)
Anion gap: 10 (ref 5–15)
BUN: 14 mg/dL (ref 8–23)
CO2: 25 mmol/L (ref 22–32)
Calcium: 9.5 mg/dL (ref 8.9–10.3)
Chloride: 101 mmol/L (ref 98–111)
Creatinine, Ser: 0.77 mg/dL (ref 0.61–1.24)
GFR calc Af Amer: >60 mL/min (ref >60)
GFR calc non Af Amer: >60 mL/min (ref >60)
Glucose, Bld: 146 mg/dL — ABNORMAL HIGH (ref 70–99)
Potassium: 4 mmol/L (ref 3.5–5.1)
Sodium: 136 mmol/L (ref 135–145)
Total Bilirubin: 0.8 mg/dL (ref 0.3–1.2)
Total Protein: 8 g/dL (ref 6.5–8.1)

## 2019-10-05 LAB — CBC WITH DIFFERENTIAL/PLATELET
Abs Immature Granulocytes: 0.02 10*3/uL (ref 0.00–0.07)
Basophils Absolute: 0.1 10*3/uL (ref 0.0–0.1)
Basophils Relative: 1 %
Eosinophils Absolute: 0.2 10*3/uL (ref 0.0–0.5)
Eosinophils Relative: 2 %
HCT: 49.8 % (ref 39.0–52.0)
Hemoglobin: 16.8 g/dL (ref 13.0–17.0)
Immature Granulocytes: 0 %
Lymphocytes Relative: 27 %
Lymphs Abs: 2.8 10*3/uL (ref 0.7–4.0)
MCH: 31.3 pg (ref 26.0–34.0)
MCHC: 33.7 g/dL (ref 30.0–36.0)
MCV: 92.9 fL (ref 80.0–100.0)
Monocytes Absolute: 0.8 10*3/uL (ref 0.1–1.0)
Monocytes Relative: 8 %
Neutro Abs: 6.3 10*3/uL (ref 1.7–7.7)
Neutrophils Relative %: 62 %
Platelets: 312 10*3/uL (ref 150–400)
RBC: 5.36 MIL/uL (ref 4.22–5.81)
RDW: 11.9 % (ref 11.5–15.5)
WBC: 10.2 10*3/uL (ref 4.0–10.5)
nRBC: 0 % (ref 0.0–0.2)

## 2019-10-05 LAB — PROTIME-INR
INR: 1 (ref 0.8–1.2)
Prothrombin Time: 12.9 seconds (ref 11.4–15.2)

## 2019-10-05 NOTE — ED Triage Notes (Signed)
Patient reports carotid stent placement procedure that was postponed due to his refusal , presents requesting reevaluation and possible stent placement .

## 2019-10-05 NOTE — ED Notes (Signed)
Pt voiced his decision to leave the hospital due to the number of patients ahead of him and the anticipated longer wait. This NT encouraged pt to stay and reminded pt that his time in the waiting room would restart if he chose to leave and then return. Pt voiced understanding of this and chose to leave.

## 2019-10-07 ENCOUNTER — Encounter (HOSPITAL_COMMUNITY): Payer: Self-pay | Admitting: Emergency Medicine

## 2019-10-07 ENCOUNTER — Emergency Department (HOSPITAL_COMMUNITY): Payer: Medicare Other

## 2019-10-07 ENCOUNTER — Inpatient Hospital Stay (HOSPITAL_COMMUNITY)
Admission: EM | Admit: 2019-10-07 | Discharge: 2019-10-10 | DRG: 026 | Disposition: A | Discharge status: Home Health | Payer: Medicare Other | Attending: Internal Medicine | Admitting: Internal Medicine

## 2019-10-07 ENCOUNTER — Other Ambulatory Visit: Payer: Self-pay

## 2019-10-07 DIAGNOSIS — R9431 Abnormal electrocardiogram [ECG] [EKG]: Secondary | ICD-10-CM | POA: Diagnosis present

## 2019-10-07 DIAGNOSIS — E876 Hypokalemia: Secondary | ICD-10-CM | POA: Diagnosis not present

## 2019-10-07 DIAGNOSIS — I63311 Cerebral infarction due to thrombosis of right middle cerebral artery: Secondary | ICD-10-CM | POA: Diagnosis not present

## 2019-10-07 DIAGNOSIS — I63231 Cerebral infarction due to unspecified occlusion or stenosis of right carotid arteries: Secondary | ICD-10-CM

## 2019-10-07 DIAGNOSIS — Z79899 Other long term (current) drug therapy: Secondary | ICD-10-CM | POA: Diagnosis not present

## 2019-10-07 DIAGNOSIS — Z9114 Patient's other noncompliance with medication regimen: Secondary | ICD-10-CM

## 2019-10-07 DIAGNOSIS — Z6830 Body mass index (BMI) 30.0-30.9, adult: Secondary | ICD-10-CM

## 2019-10-07 DIAGNOSIS — E1165 Type 2 diabetes mellitus with hyperglycemia: Secondary | ICD-10-CM | POA: Diagnosis present

## 2019-10-07 DIAGNOSIS — R911 Solitary pulmonary nodule: Secondary | ICD-10-CM | POA: Diagnosis present

## 2019-10-07 DIAGNOSIS — E785 Hyperlipidemia, unspecified: Secondary | ICD-10-CM | POA: Diagnosis present

## 2019-10-07 DIAGNOSIS — R4189 Other symptoms and signs involving cognitive functions and awareness: Secondary | ICD-10-CM | POA: Diagnosis present

## 2019-10-07 DIAGNOSIS — I6521 Occlusion and stenosis of right carotid artery: Secondary | ICD-10-CM | POA: Diagnosis present

## 2019-10-07 DIAGNOSIS — I10 Essential (primary) hypertension: Secondary | ICD-10-CM | POA: Diagnosis present

## 2019-10-07 DIAGNOSIS — Z9119 Patient's noncompliance with other medical treatment and regimen: Secondary | ICD-10-CM | POA: Diagnosis not present

## 2019-10-07 DIAGNOSIS — R32 Unspecified urinary incontinence: Secondary | ICD-10-CM | POA: Diagnosis present

## 2019-10-07 DIAGNOSIS — R1310 Dysphagia, unspecified: Secondary | ICD-10-CM | POA: Diagnosis present

## 2019-10-07 DIAGNOSIS — R29810 Facial weakness: Secondary | ICD-10-CM | POA: Diagnosis present

## 2019-10-07 DIAGNOSIS — Z7982 Long term (current) use of aspirin: Secondary | ICD-10-CM | POA: Diagnosis not present

## 2019-10-07 DIAGNOSIS — E669 Obesity, unspecified: Secondary | ICD-10-CM | POA: Diagnosis present

## 2019-10-07 DIAGNOSIS — F17200 Nicotine dependence, unspecified, uncomplicated: Secondary | ICD-10-CM | POA: Diagnosis present

## 2019-10-07 DIAGNOSIS — Z20822 Contact with and (suspected) exposure to covid-19: Secondary | ICD-10-CM | POA: Diagnosis present

## 2019-10-07 DIAGNOSIS — I69354 Hemiplegia and hemiparesis following cerebral infarction affecting left non-dominant side: Secondary | ICD-10-CM | POA: Diagnosis not present

## 2019-10-07 DIAGNOSIS — I63239 Cerebral infarction due to unspecified occlusion or stenosis of unspecified carotid arteries: Secondary | ICD-10-CM | POA: Diagnosis present

## 2019-10-07 DIAGNOSIS — Z794 Long term (current) use of insulin: Secondary | ICD-10-CM

## 2019-10-07 DIAGNOSIS — Z7902 Long term (current) use of antithrombotics/antiplatelets: Secondary | ICD-10-CM

## 2019-10-07 DIAGNOSIS — Z91199 Patient's noncompliance with other medical treatment and regimen due to unspecified reason: Secondary | ICD-10-CM

## 2019-10-07 DIAGNOSIS — I639 Cerebral infarction, unspecified: Secondary | ICD-10-CM | POA: Diagnosis present

## 2019-10-07 DIAGNOSIS — Z818 Family history of other mental and behavioral disorders: Secondary | ICD-10-CM | POA: Diagnosis not present

## 2019-10-07 HISTORY — DX: Type 2 diabetes mellitus with other specified complication: E11.69

## 2019-10-07 HISTORY — DX: Hyperlipidemia, unspecified: E78.5

## 2019-10-07 HISTORY — DX: Obesity, unspecified: E66.9

## 2019-10-07 HISTORY — DX: Type 2 diabetes mellitus with other specified complication: E66.9

## 2019-10-07 HISTORY — DX: Nicotine dependence, unspecified, uncomplicated: F17.200

## 2019-10-07 LAB — CBC
HCT: 49.7 % (ref 39.0–52.0)
Hemoglobin: 16.8 g/dL (ref 13.0–17.0)
MCH: 31.9 pg (ref 26.0–34.0)
MCHC: 33.8 g/dL (ref 30.0–36.0)
MCV: 94.3 fL (ref 80.0–100.0)
Platelets: 323 10*3/uL (ref 150–400)
RBC: 5.27 MIL/uL (ref 4.22–5.81)
RDW: 11.9 % (ref 11.5–15.5)
WBC: 9.2 10*3/uL (ref 4.0–10.5)
nRBC: 0 % (ref 0.0–0.2)

## 2019-10-07 LAB — RESPIRATORY PANEL BY RT PCR (FLU A&B, COVID)
Influenza A by PCR: NEGATIVE
Influenza B by PCR: NEGATIVE
SARS Coronavirus 2 by RT PCR: NEGATIVE

## 2019-10-07 LAB — PLATELET INHIBITION P2Y12: Platelet Function  P2Y12: 71 [PRU] — ABNORMAL LOW (ref 182–335)

## 2019-10-07 LAB — GLUCOSE, CAPILLARY: Glucose-Capillary: 100 mg/dL — ABNORMAL HIGH (ref 70–99)

## 2019-10-07 LAB — BASIC METABOLIC PANEL
Anion gap: 12 (ref 5–15)
BUN: 14 mg/dL (ref 8–23)
CO2: 21 mmol/L — ABNORMAL LOW (ref 22–32)
Calcium: 9.6 mg/dL (ref 8.9–10.3)
Chloride: 102 mmol/L (ref 98–111)
Creatinine, Ser: 0.66 mg/dL (ref 0.61–1.24)
GFR calc Af Amer: >60 mL/min (ref >60)
GFR calc non Af Amer: >60 mL/min (ref >60)
Glucose, Bld: 137 mg/dL — ABNORMAL HIGH (ref 70–99)
Potassium: 4.2 mmol/L (ref 3.5–5.1)
Sodium: 135 mmol/L (ref 135–145)

## 2019-10-07 LAB — CBG MONITORING, ED: Glucose-Capillary: 141 mg/dL — ABNORMAL HIGH (ref 70–99)

## 2019-10-07 MED ORDER — HEPARIN SODIUM (PORCINE) 5000 UNIT/ML IJ SOLN
5000.0000 [IU] | Freq: Three times a day (TID) | INTRAMUSCULAR | Status: DC
  Administered 2019-10-07 – 2019-10-08 (×3): 5000 [IU] via SUBCUTANEOUS
  Filled 2019-10-07 (×4): qty 1

## 2019-10-07 MED ORDER — CLOPIDOGREL BISULFATE 75 MG PO TABS
75.0000 mg | ORAL_TABLET | Freq: Every day | ORAL | Status: DC
  Administered 2019-10-07 – 2019-10-10 (×4): 75 mg via ORAL
  Filled 2019-10-07 (×4): qty 1

## 2019-10-07 MED ORDER — ACETAMINOPHEN 325 MG PO TABS: 650.0000 mg | ORAL_TABLET | ORAL | Status: DC | PRN

## 2019-10-07 MED ORDER — INSULIN ASPART 100 UNIT/ML ~~LOC~~ SOLN
0.0000 [IU] | Freq: Every day | SUBCUTANEOUS | Status: DC
  Administered 2019-10-08: 2 [IU] via SUBCUTANEOUS

## 2019-10-07 MED ORDER — ACETAMINOPHEN 650 MG RE SUPP: 650.0000 mg | RECTAL | Status: DC | PRN

## 2019-10-07 MED ORDER — SODIUM CHLORIDE 0.9 % IV SOLN: INTRAVENOUS | Status: DC

## 2019-10-07 MED ORDER — ATORVASTATIN CALCIUM 40 MG PO TABS
40.0000 mg | ORAL_TABLET | Freq: Every day | ORAL | Status: DC
  Administered 2019-10-08 – 2019-10-10 (×3): 40 mg via ORAL
  Filled 2019-10-07 (×4): qty 1

## 2019-10-07 MED ORDER — SENNOSIDES-DOCUSATE SODIUM 8.6-50 MG PO TABS: 1.0000 | ORAL_TABLET | Freq: Every evening | ORAL | Status: DC | PRN

## 2019-10-07 MED ORDER — ASPIRIN EC 81 MG PO TBEC
81.0000 mg | DELAYED_RELEASE_TABLET | Freq: Every day | ORAL | Status: DC
  Administered 2019-10-08 – 2019-10-10 (×3): 81 mg via ORAL
  Filled 2019-10-07 (×4): qty 1

## 2019-10-07 MED ORDER — NICOTINE 14 MG/24HR TD PT24
14.0000 mg | MEDICATED_PATCH | Freq: Every day | TRANSDERMAL | Status: DC
  Filled 2019-10-07 (×3): qty 1

## 2019-10-07 MED ORDER — ACETAMINOPHEN 160 MG/5ML PO SOLN: 650.0000 mg | ORAL | Status: DC | PRN

## 2019-10-07 MED ORDER — LISINOPRIL 5 MG PO TABS
5.0000 mg | ORAL_TABLET | Freq: Every day | ORAL | Status: DC
  Administered 2019-10-07 – 2019-10-08 (×2): 5 mg via ORAL
  Filled 2019-10-07 (×2): qty 1

## 2019-10-07 MED ORDER — INSULIN ASPART 100 UNIT/ML ~~LOC~~ SOLN
0.0000 [IU] | Freq: Three times a day (TID) | SUBCUTANEOUS | Status: DC
  Administered 2019-10-07: 2 [IU] via SUBCUTANEOUS
  Administered 2019-10-08: 3 [IU] via SUBCUTANEOUS
  Administered 2019-10-09: 2 [IU] via SUBCUTANEOUS
  Administered 2019-10-09: 3 [IU] via SUBCUTANEOUS
  Administered 2019-10-09: 2 [IU] via SUBCUTANEOUS
  Administered 2019-10-10 (×2): 3 [IU] via SUBCUTANEOUS

## 2019-10-07 NOTE — ED Triage Notes (Signed)
Patient is requesting carotid stents placement , postponed last 10/04/19 due to patients refusal , alert and oriented at triage , no neuro deficits , speech clear/ no facial asymmetry , equal strong grips with no arm drift . Respirations unlabored /denies pain . Daughter stated intermittent confusion this weekend .

## 2019-10-07 NOTE — Progress Notes (Signed)
Chaplain visited with the patient as a result of a consult for an AD. The chaplain delivered the paperwork and the family will fill it out this evening. The family is hoping to complete the AD before the patient has a procedure tomorrow. The chaplain will follow-up with the patient tomorrow morning.  Lavone Neri Chaplain Resident For questions concerning this note please contact me by pager [phone]

## 2019-10-07 NOTE — ED Notes (Signed)
CBG results of 141 reported to Selena Batten, Charity fundraiser.

## 2019-10-07 NOTE — ED Notes (Signed)
Dinner ordered 

## 2019-10-07 NOTE — ED Notes (Signed)
Family remains at the bedside.

## 2019-10-07 NOTE — Consult Note (Signed)
Chief Complaint: Patient was seen in consultation today for right ICA severe stenosis/ revascularization.  Referring Physician(s): Leonie Man, Loann Quill  Supervising Physician: Luanne Bras  Patient Status: [facility] - [facility]  History of Present Illness: David Hendrix is a 64 y.o. male with a past medical history of hypertension, dyslipidemia, CVA 09/2019, diabetes mellitus type II, and tobacco use. He is known to [facility] and has been followed by Dr. Estanislado Pandy since 09/2019. He first presented to our department at the request of Dr. Leonie Man on an inpatient basis for management of right ICA occlusion, thought to be cause of CVA 09/2019. He underwent an image-guided diagnostic cerebral arteriogram in IR 10/03/2019 which revealed severe right ICA stenosis. During that admission, patient was given option for revascularization while inpatient versus return for procedure on outpatient basis, however patient opted for procedure on outpatient basis. He was discharged home 10/04/2019 in stable condition with plans for IR schedulers to call patient to set up procedure. However, patient presented to [facility] ED 10/05/2019 and 10/07/2019 requesting carotid stent placement. In addition, daughter complaining of increased confusion. Neurology was consulted who recommended admission and NIR consultation for possible carotid stenting on inpatient basis.  NIR requested by Dr. Cheral Marker for management of right ICA severe stenosis. Patient awake and alert laying in bed watching TV. Accompanied by daughter at bedside. Alert and oriented to person and place but not time (states its 2021 but thinks its Tuesday or Wednesday, and that the date is the 15th). Daughter complains of increased confusion (leaving doors open, memory loss, ect)- probably residual from CVA 09/2019. History difficult to obtain due to confusion.   Past Medical History:  Diagnosis Date  . Diabetes mellitus type 2 in obese ([place])   . Dyslipidemia   .  Hypertension   . Stroke ([place]) 10/01/2019   large R frontal lobe CVA  . Tobacco dependence    none since 10/01/19    Past Surgical History:  Procedure Laterality Date  . BACK SURGERY     x2, has hardware  . ELBOW SURGERY    . IR ANGIO INTRA EXTRACRAN SEL COM CAROTID INNOMINATE BILAT MOD SED  10/03/2019  . IR ANGIO VERTEBRAL SEL SUBCLAVIAN INNOMINATE UNI L MOD SED  10/03/2019  . IR ANGIO VERTEBRAL SEL VERTEBRAL UNI R MOD SED  10/03/2019  . IR US GUIDE VASC ACCESS RIGHT  10/03/2019    Allergies: Patient has no known allergies.  Medications: Prior to Admission medications   Medication Sig Start Date End Date Taking? Authorizing Provider  ACCU-CHEK GUIDE test strip 1 each by Other route in the morning, at noon, in the evening, and at bedtime. 10/04/19  Yes [provider]  Accu-Chek Softclix Lancets lancets 1 each by Other route in the morning, at noon, in the evening, and at bedtime. 10/04/19  Yes [provider]  aspirin EC 81 MG tablet Take 1 tablet (81 mg total) by mouth daily. 10/04/19 10/03/20 Yes Florencia Reasons, MD  atorvastatin (LIPITOR) 40 MG tablet Take 1 tablet (40 mg total) by mouth daily. 10/05/19  Yes Florencia Reasons, MD  blood glucose meter kit and supplies KIT Dispense based on patient and insurance preference. Use up to four times daily as directed. (FOR ICD-9 250.00, 250.01). Patient taking differently: 1 each by Other route See admin instructions. Dispense based on patient and insurance preference. Use up to four times daily as directed. (FOR ICD-9 250.00, 250.01). 10/04/19  Yes Florencia Reasons, MD  clopidogrel (PLAVIX) 75 MG tablet Take  1 tablet (75 mg total) by mouth daily. 10/04/19 10/03/20 Yes Florencia Reasons, MD  lisinopril (ZESTRIL) 5 MG tablet Take 1 tablet (5 mg total) by mouth daily. 10/04/19 10/03/20 Yes Florencia Reasons, MD  metFORMIN (GLUCOPHAGE) 500 MG tablet Take 1 tablet (500 mg total) by mouth 2 (two) times daily with a meal. 10/04/19 10/03/20 Yes Florencia Reasons, MD     Family History    Problem Relation Age of Onset  . Dementia Mother   . Anxiety disorder Mother   . Cirrhosis Father   . Stroke Neg Hx     Social History   Socioeconomic History  . Marital status: Single    Spouse name: Not on file  . Number of children: Not on file  . Years of education: Not on file  . Highest education level: Not on file  Occupational History  . Not on file  Tobacco Use  . Smoking status: Former Smoker    Packs/day: 0.50    Years: 35.00    Pack years: 17.50    Quit date: 10/01/2019    Years since quitting: 0.0  . Smokeless tobacco: Never Used  Substance and Sexual Activity  . Alcohol use: Not Currently    Comment: remote h/o heavy use  . Drug use: Not Currently    Types: Methamphetamines, Cocaine, Marijuana  . Sexual activity: Not on file  Other Topics Concern  . Not on file  Social History Narrative  . Not on file   Social Determinants of Health   Financial Resource Strain:   . Difficulty of Paying Living Expenses:   Food Insecurity:   . Worried About Charity fundraiser in the Last Year:   . Arboriculturist in the Last Year:   Transportation Needs:   . Film/video editor (Medical):   Marland Kitchen Lack of Transportation (Non-Medical):   Physical Activity:   . Days of Exercise per Week:   . Minutes of Exercise per Session:   Stress:   . Feeling of Stress :   Social Connections:   . Frequency of Communication with Friends and Family:   . Frequency of Social Gatherings with Friends and Family:   . Attends Religious Services:   . Active Member of Clubs or Organizations:   . Attends Archivist Meetings:   Marland Kitchen Marital Status:      Review of Systems: A 12 point ROS discussed and pertinent positives are indicated in the HPI above.  All other systems are negative.  Review of Systems  Unable to perform ROS: Mental status change    Vital Signs: BP (!) 156/95   Pulse 62   Temp 98.1 F (36.7 C) (Oral)   Resp 18   Ht 5' 11"  (1.803 m)   Wt 220 lb 7.4 oz  (100 kg)   SpO2 96%   BMI 30.75 kg/m   Physical Exam Vitals and nursing note reviewed.  Constitutional:      General: He is not in acute distress.    Appearance: Normal appearance.  Cardiovascular:     Rate and Rhythm: Normal rate and regular rhythm.     Heart sounds: Normal heart sounds. No murmur.  Pulmonary:     Effort: Pulmonary effort is normal. No respiratory distress.     Breath sounds: Normal breath sounds. No wheezing.  Skin:    General: Skin is warm and dry.  Neurological:     Mental Status: He is alert.     Comments: Alert and oriented  to person and place but not time (states its 2021 but thinks its Tuesday or Wednesday, and that the date is the 15th).      MD Evaluation Airway: WNL Heart: WNL Abdomen: WNL Chest/ Lungs: WNL ASA  Classification: 3 Mallampati/Airway Score: Two   Imaging: CT ANGIO HEAD W OR WO CONTRAST  Addendum Date: 10/01/2019   ADDENDUM REPORT: 10/01/2019 18:55 ADDENDUM: Aortic Atherosclerosis (ICD10-I70.0) and Emphysema (ICD10-J43.9). 5 mm pulmonary nodule right upper lobe. Non-contrast chest CT can be considered in 12 months if patient is high-risk, which appears to be the case based on the emphysema. This recommendation follows the consensus statement: Guidelines for Management of Incidental Pulmonary Nodules Detected on CT Images: From the Fleischner Society 2017; Radiology 2017; 284:228-243. Electronically Signed   By: Nelson Chimes M.D.   On: 10/01/2019 18:55   Result Date: 10/01/2019 CLINICAL DATA:  Weakness and facial droop. Difficulty swallowing. Acute infarction right frontal lobe. Abnormal flow right internal carotid artery. EXAM: CT ANGIOGRAPHY HEAD AND NECK TECHNIQUE: Multidetector CT imaging of the head and neck was performed using the standard protocol during bolus administration of intravenous contrast. Multiplanar CT image reconstructions and MIPs were obtained to evaluate the vascular anatomy. Carotid stenosis measurements (when  applicable) are obtained utilizing NASCET criteria, using the distal internal carotid diameter as the denominator. CONTRAST:  29m OMNIPAQUE IOHEXOL 350 MG/ML SOLN COMPARISON:  CT and MRI same day FINDINGS: Aortic atherosclerosis. Branching pattern is normal without origin stenosis. CTA NECK FINDINGS Aortic arch: Aortic atherosclerosis. Branching pattern is normal without origin stenosis. Right carotid system: Common carotid artery widely patent to the bifurcation. Occlusion of the ICA in the bulb. No reconstituted flow seen in the cervical region. Left carotid system: Common carotid artery widely patent to the bifurcation. Calcified and soft plaque at the carotid bifurcation and ICA bulb but no stenosis compared to the more distal cervical ICA diameter. Vertebral arteries: Both vertebral artery origins are widely patent. The vertebral arteries are patent through the cervical region to the foramen magnum without stenosis greater than 30%. Skeleton: Ordinary cervical spondylosis and facet arthropathy. Other neck: No mass or lymphadenopathy. Upper chest: Emphysema and pulmonary scarring. 5 mm pulmonary nodule in the right upper lobe. Review of the MIP images confirms the above findings CTA HEAD FINDINGS Anterior circulation: Right internal carotid artery does not show any flow through the skull base. Reconstituted in flow at the carotid terminus primarily on the basis of a patent anterior communicating and posterior communicating arteries. Left internal carotid artery is patent through the siphon region without stenosis greater than 30%. No large or medium vessel occlusion seen on the left. On the right, there is a missing M2 to M3 branch serving the frontal operculum. Posterior circulation: Both vertebral arteries widely patent through the foramen magnum. Right vertebral terminates in PICA. Left vertebral artery supplies the basilar. No basilar stenosis. Posterior circulation branch vessels are patent. Distal branch  vessels do show atherosclerotic irregularity. Venous sinuses: Patent and normal. Anatomic variants: None significant. Review of the MIP images confirms the above findings IMPRESSION: Right internal carotid artery occlusion in the ICA bulb. Flow to the anterior circulation on the right occurs by patent anterior and posterior communicating arteries. I do not see significant external to internal collateral flow. Apparent missing M2 to M3 branch serving the right frontal operculum infarction region. Electronically Signed: By: MNelson ChimesM.D. On: 10/01/2019 18:46   DG Chest 2 View  Result Date: 10/01/2019 CLINICAL DATA:  Hypertension CVA EXAM: CHEST -  2 VIEW COMPARISON:  None. FINDINGS: No focal airspace disease or pleural effusion. Borderline cardiomegaly. Hazy/streaky atelectasis or scarring at the bases. No pneumothorax. IMPRESSION: Hazy/streaky atelectasis or scarring at the bases. Electronically Signed   By: Donavan Foil M.D.   On: 10/01/2019 17:01   CT HEAD WO CONTRAST  Result Date: 10/01/2019 CLINICAL DATA:  Weakness, possible stroke. EXAM: CT HEAD WITHOUT CONTRAST TECHNIQUE: Contiguous axial images were obtained from the base of the skull through the vertex without intravenous contrast. COMPARISON:  None. FINDINGS: Brain: Large right frontal low density is noted most consistent with acute infarction. No midline shift is noted. Ventricular size is within normal limits. No definite hemorrhage is noted. Vascular: No hyperdense vessel or unexpected calcification. Skull: Normal. Negative for fracture or focal lesion. Sinuses/Orbits: No acute finding. Other: None. IMPRESSION: Large right frontal low density is noted most consistent with acute infarction. No definite hemorrhage is noted. MRI is recommended for further evaluation. Electronically Signed   By: Marijo Conception M.D.   On: 10/01/2019 13:47   CT ANGIO NECK W OR WO CONTRAST  Addendum Date: 10/01/2019   ADDENDUM REPORT: 10/01/2019 18:55 ADDENDUM:  Aortic Atherosclerosis (ICD10-I70.0) and Emphysema (ICD10-J43.9). 5 mm pulmonary nodule right upper lobe. Non-contrast chest CT can be considered in 12 months if patient is high-risk, which appears to be the case based on the emphysema. This recommendation follows the consensus statement: Guidelines for Management of Incidental Pulmonary Nodules Detected on CT Images: From the Fleischner Society 2017; Radiology 2017; 284:228-243. Electronically Signed   By: Nelson Chimes M.D.   On: 10/01/2019 18:55   Result Date: 10/01/2019 CLINICAL DATA:  Weakness and facial droop. Difficulty swallowing. Acute infarction right frontal lobe. Abnormal flow right internal carotid artery. EXAM: CT ANGIOGRAPHY HEAD AND NECK TECHNIQUE: Multidetector CT imaging of the head and neck was performed using the standard protocol during bolus administration of intravenous contrast. Multiplanar CT image reconstructions and MIPs were obtained to evaluate the vascular anatomy. Carotid stenosis measurements (when applicable) are obtained utilizing NASCET criteria, using the distal internal carotid diameter as the denominator. CONTRAST:  48m OMNIPAQUE IOHEXOL 350 MG/ML SOLN COMPARISON:  CT and MRI same day FINDINGS: Aortic atherosclerosis. Branching pattern is normal without origin stenosis. CTA NECK FINDINGS Aortic arch: Aortic atherosclerosis. Branching pattern is normal without origin stenosis. Right carotid system: Common carotid artery widely patent to the bifurcation. Occlusion of the ICA in the bulb. No reconstituted flow seen in the cervical region. Left carotid system: Common carotid artery widely patent to the bifurcation. Calcified and soft plaque at the carotid bifurcation and ICA bulb but no stenosis compared to the more distal cervical ICA diameter. Vertebral arteries: Both vertebral artery origins are widely patent. The vertebral arteries are patent through the cervical region to the foramen magnum without stenosis greater than 30%.  Skeleton: Ordinary cervical spondylosis and facet arthropathy. Other neck: No mass or lymphadenopathy. Upper chest: Emphysema and pulmonary scarring. 5 mm pulmonary nodule in the right upper lobe. Review of the MIP images confirms the above findings CTA HEAD FINDINGS Anterior circulation: Right internal carotid artery does not show any flow through the skull base. Reconstituted in flow at the carotid terminus primarily on the basis of a patent anterior communicating and posterior communicating arteries. Left internal carotid artery is patent through the siphon region without stenosis greater than 30%. No large or medium vessel occlusion seen on the left. On the right, there is a missing M2 to M3 branch serving the frontal operculum. Posterior  circulation: Both vertebral arteries widely patent through the foramen magnum. Right vertebral terminates in PICA. Left vertebral artery supplies the basilar. No basilar stenosis. Posterior circulation branch vessels are patent. Distal branch vessels do show atherosclerotic irregularity. Venous sinuses: Patent and normal. Anatomic variants: None significant. Review of the MIP images confirms the above findings IMPRESSION: Right internal carotid artery occlusion in the ICA bulb. Flow to the anterior circulation on the right occurs by patent anterior and posterior communicating arteries. I do not see significant external to internal collateral flow. Apparent missing M2 to M3 branch serving the right frontal operculum infarction region. Electronically Signed: By: Nelson Chimes M.D. On: 10/01/2019 18:46   MR BRAIN WO CONTRAST  Result Date: 10/01/2019 CLINICAL DATA:  Hypertension and diabetes. Difficulty swallowing. Weakness. Abnormal head CT right frontal region. EXAM: MRI HEAD WITHOUT CONTRAST TECHNIQUE: Multiplanar, multiecho pulse sequences of the brain and surrounding structures were obtained without intravenous contrast. COMPARISON:  Head CT same day FINDINGS: Brain: There  is acute infarction in the right insula and frontal operculum with the region measuring approximately 6.6 x 6.1 x 4.0 cm. Swelling of the region of infarction but without hemorrhage. Punctate acute infarction in the medial right hypothalamus. No focal abnormality affects the brainstem or cerebellum. Cerebral hemispheres otherwise show moderate chronic small-vessel changes of the deep and subcortical white matter. There is an old cortical and subcortical infarction in the right occipital lobe. No midline shift. No hydrocephalus. No extra-axial collection. Vascular: Abnormal flow pattern in the right internal carotid artery. There could be right internal carotid artery occlusion or very slow flow. Skull and upper cervical spine: Negative Sinuses/Orbits: Mild seasonal mucosal inflammatory changes of the sinuses. Orbits negative. Other: None IMPRESSION: 6.6 x 6.1 x 4.0 cm region of acute infarction affecting the right insula and right frontal operculum consistent with right MCA branch vessel infarction. Slow flow or no flow in the right ICA at the skull base. The infarction shows mild swelling but there is no mass effect or shift. No sign of blood products within the infarction. Punctate acute infarction also demonstrated in the medial right hypothalamus. Background pattern of moderate chronic small-vessel ischemic changes affecting the cerebral hemispheric white matter. Old right occipital cortical and subcortical infarction. Electronically Signed   By: Nelson Chimes M.D.   On: 10/01/2019 17:42   IR US Guide Vasc Access Right  Result Date: 10/07/2019 CLINICAL DATA:  Recent right hemispheric ischemic infarct related to proximal right internal carotid artery near complete occlusion. EXAM: BILATERAL COMMON CAROTID AND INNOMINATE ANGIOGRAPHY; IR ANGIO VERTEBRAL SEL SUBCLAVIAN INNOMINATE UNI LEFT MOD SED; IR ANGIO VERTEBRAL SEL VERTEBRAL UNI RIGHT MOD SED; IR ULTRASOUND GUIDANCE VASC ACCESS RIGHT COMPARISON:  CTA of the  head and neck of October 01, 2019. MEDICATIONS: Heparin 2000 units. No antibiotic was administered within 1 hour of the procedure. ANESTHESIA/SEDATION: Versed 1 mg IV; Fentanyl 50 mcg IV Moderate Sedation Time:  40 minutes The patient was continuously monitored during the procedure by the interventional radiology nurse under my direct supervision. CONTRAST:  Isovue 300 approximately 60 cc. FLUOROSCOPY TIME:  Fluoroscopy Time: 17 minutes 48 seconds 989 mGy). COMPLICATIONS: None immediate. TECHNIQUE: Informed written consent was obtained from the patient after a thorough discussion of the procedural risks, benefits and alternatives. All questions were addressed. Maximal Sterile Barrier Technique was utilized including caps, mask, sterile gowns, sterile gloves, sterile drape, hand hygiene and skin antiseptic. A timeout was performed prior to the initiation of the procedure. The right forearm to the  wrist was prepped and draped in the usual sterile fashion. The right radial artery was identified, and its morphology documented with ultrasound. A dorsal palmar anastomosis was then verified to be present. Using ultrasound guidance, radial access was obtained via a micropuncture set. Over a 0.018 inch micro guidewire, a 4/5 French radial sheath was then inserted. The obturator and the micro guidewire were removed. Good aspiration obtained from the side port of the sheath. A cocktail of 2000 units of heparin, 2.5 mg of verapamil, and 200 mcg of nitroglycerin was then infused in diluted form without event. A right radial arteriogram was obtained. Over a 0.035 inch Roadrunner guidewire, a 5 [place] Simmons 2 diagnostic catheter was then advanced to the aortic arch region, and selectively positioned in the right vertebral artery, the right common carotid artery, the left common carotid artery and the left subclavian artery. After the procedure, hemostasis in the right radial puncture site was achieved using a wrist band. Distal  right radial pulse was verified to be present. FINDINGS: The origin of the right vertebral artery is widely patent. The vessel is seen to opacify to the cranial skull base. Wide patency is seen of the vertebrobasilar junction. The right vertebral artery terminates and the right posterior-inferior cerebellar artery and the right common carotid arteriogram demonstrates the right external carotid artery and its major branches to be widely patent. The right internal carotid artery at the bulb demonstrates near complete occlusion with a delayed string sign which ascends to the cranial skull base. More distally no reconstitution is seen of the right internal carotid artery from the external carotid artery branches. The left common carotid arteriogram demonstrates the left external carotid artery and its major branches to be widely patent. The left internal carotid artery at the bulb to the cranial skull base demonstrates wide patency. The petrous, the cavernous and the supraclinoid segments are widely patent with mild arteriosclerotic narrowing of the cavernous segments. The left middle cerebral artery and the left anterior cerebral artery opacify into the capillary and venous phases. Mild fusiform dilatation of the distal M1 segment of the left middle cerebral artery. There is partial reconstitution of the right anterior cerebral artery distribution in the A3 A4 regions from cross-filling from the distal pericallosal interhemispheric anastomosis. The left subclavian arteriogram demonstrates patency of the left vertebral artery. Mild atherosclerotic narrowing is seen of the proximal left vertebral artery. More distally, the vessel is seen to opacify to the cranial skull base. The left vertebrobasilar junction and the left posterior-inferior cerebellar artery opacify. The basilar artery, the posterior cerebral arteries, the superior cerebellar arteries and the anterior-inferior cerebellar arteries opacify into the  capillary and venous phases. Prompt retrograde filling via the right posterior communicating artery of the supraclinoid right ICA is seen. More delayed images demonstrate opacification of the right middle cerebral artery and the right anterior cerebral artery proximal A1 segment. Occlusion of the superior division of the right middle cerebral artery is seen. IMPRESSION: Near complete occlusion of the right internal carotid artery at the bulb with an angiographic string sign proximally. Reconstitution of the right middle cerebral artery and the right anterior cerebral A1 segment from the left vertebral artery via the right posterior communicating artery. PLAN: Findings reviewed with the patient and the patient's neurologist. Electronically Signed   By: Luanne Bras M.D.   On: 10/03/2019 15:53   ECHOCARDIOGRAM COMPLETE  Result Date: 10/02/2019    ECHOCARDIOGRAM REPORT   Patient Name:   PAGE LANCON Date of Exam: 10/02/2019  Medical Rec #:  [id number]       Height:       70.0 in Accession #:    [id number]      Weight:       210.0 lb Date of Birth:  Aug 13, 1955       BSA:          2.131 m Patient Age:    59 years        BP:           136/54 mmHg Patient Gender: M               HR:           65 bpm. Exam Location:  Inpatient Procedure: 2D Echo Indications:    TIA 435.9 / G45.9  History:        Patient has no prior history of Echocardiogram examinations. No                 prior cardiac history.  Sonographer:    Vikki Ports Turrentine Referring Phys: [id number] Lequita Halt  Sonographer Comments: Poor patient compliance. IMPRESSIONS  1. Left ventricular ejection fraction, by estimation, is 60 to 65%. The left ventricle has normal function. The left ventricle has no regional wall motion abnormalities. There is mild concentric left ventricular hypertrophy. Left ventricular diastolic parameters are consistent with Grade I diastolic dysfunction (impaired relaxation).  2. Right ventricular systolic function is normal. The  right ventricular size is normal. There is normal pulmonary artery systolic pressure.  3. The mitral valve is normal in structure. Trivial mitral valve regurgitation. No evidence of mitral stenosis.  4. The aortic valve is normal in structure. Aortic valve regurgitation is mild. No aortic stenosis is present.  5. The inferior vena cava is normal in size with greater than 50% respiratory variability, suggesting right atrial pressure of 3 mmHg. FINDINGS  Left Ventricle: Left ventricular ejection fraction, by estimation, is 60 to 65%. The left ventricle has normal function. The left ventricle has no regional wall motion abnormalities. The left ventricular internal cavity size was normal in size. There is  mild concentric left ventricular hypertrophy. Left ventricular diastolic parameters are consistent with Grade I diastolic dysfunction (impaired relaxation). Normal left ventricular filling pressure. Right Ventricle: The right ventricular size is normal. No increase in right ventricular wall thickness. Right ventricular systolic function is normal. There is normal pulmonary artery systolic pressure. The tricuspid regurgitant velocity is 2.09 m/s, and  with an assumed right atrial pressure of 8 mmHg, the estimated right ventricular systolic pressure is 67.8 mmHg. Left Atrium: Left atrial size was normal in size. Right Atrium: Right atrial size was normal in size. Pericardium: There is no evidence of pericardial effusion. Mitral Valve: The mitral valve is normal in structure. Normal mobility of the mitral valve leaflets. Trivial mitral valve regurgitation. No evidence of mitral valve stenosis. Tricuspid Valve: The tricuspid valve is normal in structure. Tricuspid valve regurgitation is mild . No evidence of tricuspid stenosis. Aortic Valve: The aortic valve is normal in structure. Aortic valve regurgitation is mild. No aortic stenosis is present. Aortic valve mean gradient measures 5.0 mmHg. Aortic valve peak gradient  measures 7.7 mmHg. Aortic valve area, by VTI measures 2.37 cm. Pulmonic Valve: The pulmonic valve was normal in structure. Pulmonic valve regurgitation is trivial. No evidence of pulmonic stenosis. Aorta: The aortic root is normal in size and structure. Venous: The inferior vena cava is normal in size with greater than 50% respiratory variability, suggesting  right atrial pressure of 3 mmHg. IAS/Shunts: No atrial level shunt detected by color flow Doppler.  LEFT VENTRICLE PLAX 2D LVIDd:         4.60 cm  Diastology LVIDs:         3.09 cm  LV e' lateral:   9.68 cm/s LV PW:         1.11 cm  LV E/e' lateral: 7.4 LV IVS:        1.11 cm  LV e' medial:    7.72 cm/s LVOT diam:     2.00 cm  LV E/e' medial:  9.3 LV SV:         78 LV SV Index:   36 LVOT Area:     3.14 cm  RIGHT VENTRICLE RV S prime:     11.30 cm/s TAPSE (M-mode): 2.1 cm LEFT ATRIUM             Index       RIGHT ATRIUM           Index LA diam:        3.40 cm 1.60 cm/m  RA Area:     16.30 cm LA Vol (A2C):   36.4 ml 17.08 ml/m RA Volume:   43.70 ml  20.51 ml/m LA Vol (A4C):   40.3 ml 18.91 ml/m LA Biplane Vol: 41.1 ml 19.29 ml/m  AORTIC VALVE AV Area (Vmax):    2.62 cm AV Area (Vmean):   2.37 cm AV Area (VTI):     2.37 cm AV Vmax:           139.00 cm/s AV Vmean:          105.000 cm/s AV VTI:            0.327 m AV Peak Grad:      7.7 mmHg AV Mean Grad:      5.0 mmHg LVOT Vmax:         116.00 cm/s LVOT Vmean:        79.100 cm/s LVOT VTI:          0.247 m LVOT/AV VTI ratio: 0.76  AORTA Ao Root diam: 3.50 cm MITRAL VALVE                TRICUSPID VALVE MV Area (PHT): 2.76 cm     TR Peak grad:   17.5 mmHg MV Decel Time: 275 msec     TR Vmax:        209.00 cm/s MV E velocity: 72.00 cm/s MV A velocity: 111.00 cm/s  SHUNTS MV E/A ratio:  0.65         Systemic VTI:  0.25 m                             Systemic Diam: 2.00 cm Fransico Him MD Electronically signed by Fransico Him MD Signature Date/Time: 10/02/2019/9:52:20 AM    Final    VAS US CAROTID  Result  Date: 10/02/2019 Carotid Arterial Duplex Study Indications:   CVA and Carotid artery disease. Other Factors: CT showed occluded ICA. Performing Technologist: June Leap RDMS, RVT  Examination Guidelines: A complete evaluation includes B-mode imaging, spectral Doppler, color Doppler, and power Doppler as needed of all accessible portions of each vessel. Bilateral testing is considered an integral part of a complete examination. Limited examinations for reoccurring indications may be performed as noted.  Right Carotid Findings: +----------+--------+--------+--------+------------------+---------------------+  PSV cm/sEDV cm/sStenosisPlaque DescriptionComments              +----------+--------+--------+--------+------------------+---------------------+ CCA Prox  81      9                                                       +----------+--------+--------+--------+------------------+---------------------+ CCA Distal42      5                                                       +----------+--------+--------+--------+------------------+---------------------+ ICA Prox  99              Occluded                  near occlusion,                                                           minimal flow noted                                                        proximally            +----------+--------+--------+--------+------------------+---------------------+ ECA       183     19                                                      +----------+--------+--------+--------+------------------+---------------------+ +----------+--------+-------+--------+-------------------+           PSV cm/sEDV cmsDescribeArm Pressure (mmHG) +----------+--------+-------+--------+-------------------+ [id number]            Stenotic                    +----------+--------+-------+--------+-------------------+ +---------+--------+--+--------+-+---------+ VertebralPSV  cm/s74EDV cm/s5Antegrade +---------+--------+--+--------+-+---------+  Left Carotid Findings: +----------+--------+--------+--------+------------------+--------+           PSV cm/sEDV cm/sStenosisPlaque DescriptionComments +----------+--------+--------+--------+------------------+--------+ CCA Prox  96      19                                         +----------+--------+--------+--------+------------------+--------+ CCA Distal69      21                                         +----------+--------+--------+--------+------------------+--------+ ICA Prox  58      21      1-39%   heterogenous               +----------+--------+--------+--------+------------------+--------+ ICA Distal107     30                                         +----------+--------+--------+--------+------------------+--------+  ECA       169     11                                         +----------+--------+--------+--------+------------------+--------+ +----------+--------+--------+----------------+-------------------+           PSV cm/sEDV cm/sDescribe        Arm Pressure (mmHG) +----------+--------+--------+----------------+-------------------+ Subclavian217             Multiphasic, WNL                    +----------+--------+--------+----------------+-------------------+ +---------+--------+--+--------+--+---------+ VertebralPSV cm/s91EDV cm/s29Antegrade +---------+--------+--+--------+--+---------+   Summary: Right Carotid: Evidence consistent with a total occlusion of the right ICA. Left Carotid: Velocities in the left ICA are consistent with a 1-39% stenosis. Vertebrals:  Bilateral vertebral arteries demonstrate antegrade flow. Subclavians: Normal flow hemodynamics were seen in bilateral subclavian              arteries. *See table(s) above for measurements and observations.  Electronically signed by Antony Contras MD on 10/02/2019 at 5:22:55 PM.    Final    IR ANGIO INTRA  EXTRACRAN SEL COM CAROTID INNOMINATE BILAT MOD SED  Result Date: 10/07/2019 CLINICAL DATA:  Recent right hemispheric ischemic infarct related to proximal right internal carotid artery near complete occlusion. EXAM: BILATERAL COMMON CAROTID AND INNOMINATE ANGIOGRAPHY; IR ANGIO VERTEBRAL SEL SUBCLAVIAN INNOMINATE UNI LEFT MOD SED; IR ANGIO VERTEBRAL SEL VERTEBRAL UNI RIGHT MOD SED; IR ULTRASOUND GUIDANCE VASC ACCESS RIGHT COMPARISON:  CTA of the head and neck of October 01, 2019. MEDICATIONS: Heparin 2000 units. No antibiotic was administered within 1 hour of the procedure. ANESTHESIA/SEDATION: Versed 1 mg IV; Fentanyl 50 mcg IV Moderate Sedation Time:  40 minutes The patient was continuously monitored during the procedure by the interventional radiology nurse under my direct supervision. CONTRAST:  Isovue 300 approximately 60 cc. FLUOROSCOPY TIME:  Fluoroscopy Time: 17 minutes 48 seconds 989 mGy). COMPLICATIONS: None immediate. TECHNIQUE: Informed written consent was obtained from the patient after a thorough discussion of the procedural risks, benefits and alternatives. All questions were addressed. Maximal Sterile Barrier Technique was utilized including caps, mask, sterile gowns, sterile gloves, sterile drape, hand hygiene and skin antiseptic. A timeout was performed prior to the initiation of the procedure. The right forearm to the wrist was prepped and draped in the usual sterile fashion. The right radial artery was identified, and its morphology documented with ultrasound. A dorsal palmar anastomosis was then verified to be present. Using ultrasound guidance, radial access was obtained via a micropuncture set. Over a 0.018 inch micro guidewire, a 4/5 French radial sheath was then inserted. The obturator and the micro guidewire were removed. Good aspiration obtained from the side port of the sheath. A cocktail of 2000 units of heparin, 2.5 mg of verapamil, and 200 mcg of nitroglycerin was then infused in diluted  form without event. A right radial arteriogram was obtained. Over a 0.035 inch Roadrunner guidewire, a 5 [place] Simmons 2 diagnostic catheter was then advanced to the aortic arch region, and selectively positioned in the right vertebral artery, the right common carotid artery, the left common carotid artery and the left subclavian artery. After the procedure, hemostasis in the right radial puncture site was achieved using a wrist band. Distal right radial pulse was verified to be present. FINDINGS: The origin of the right vertebral artery is widely patent. The vessel is seen to  opacify to the cranial skull base. Wide patency is seen of the vertebrobasilar junction. The right vertebral artery terminates and the right posterior-inferior cerebellar artery and the right common carotid arteriogram demonstrates the right external carotid artery and its major branches to be widely patent. The right internal carotid artery at the bulb demonstrates near complete occlusion with a delayed string sign which ascends to the cranial skull base. More distally no reconstitution is seen of the right internal carotid artery from the external carotid artery branches. The left common carotid arteriogram demonstrates the left external carotid artery and its major branches to be widely patent. The left internal carotid artery at the bulb to the cranial skull base demonstrates wide patency. The petrous, the cavernous and the supraclinoid segments are widely patent with mild arteriosclerotic narrowing of the cavernous segments. The left middle cerebral artery and the left anterior cerebral artery opacify into the capillary and venous phases. Mild fusiform dilatation of the distal M1 segment of the left middle cerebral artery. There is partial reconstitution of the right anterior cerebral artery distribution in the A3 A4 regions from cross-filling from the distal pericallosal interhemispheric anastomosis. The left subclavian arteriogram  demonstrates patency of the left vertebral artery. Mild atherosclerotic narrowing is seen of the proximal left vertebral artery. More distally, the vessel is seen to opacify to the cranial skull base. The left vertebrobasilar junction and the left posterior-inferior cerebellar artery opacify. The basilar artery, the posterior cerebral arteries, the superior cerebellar arteries and the anterior-inferior cerebellar arteries opacify into the capillary and venous phases. Prompt retrograde filling via the right posterior communicating artery of the supraclinoid right ICA is seen. More delayed images demonstrate opacification of the right middle cerebral artery and the right anterior cerebral artery proximal A1 segment. Occlusion of the superior division of the right middle cerebral artery is seen. IMPRESSION: Near complete occlusion of the right internal carotid artery at the bulb with an angiographic string sign proximally. Reconstitution of the right middle cerebral artery and the right anterior cerebral A1 segment from the left vertebral artery via the right posterior communicating artery. PLAN: Findings reviewed with the patient and the patient's neurologist. Electronically Signed   By: Luanne Bras M.D.   On: 10/03/2019 15:53   IR ANGIO VERTEBRAL SEL SUBCLAVIAN INNOMINATE UNI L MOD SED  Result Date: 10/07/2019 CLINICAL DATA:  Recent right hemispheric ischemic infarct related to proximal right internal carotid artery near complete occlusion. EXAM: BILATERAL COMMON CAROTID AND INNOMINATE ANGIOGRAPHY; IR ANGIO VERTEBRAL SEL SUBCLAVIAN INNOMINATE UNI LEFT MOD SED; IR ANGIO VERTEBRAL SEL VERTEBRAL UNI RIGHT MOD SED; IR ULTRASOUND GUIDANCE VASC ACCESS RIGHT COMPARISON:  CTA of the head and neck of October 01, 2019. MEDICATIONS: Heparin 2000 units. No antibiotic was administered within 1 hour of the procedure. ANESTHESIA/SEDATION: Versed 1 mg IV; Fentanyl 50 mcg IV Moderate Sedation Time:  40 minutes The patient was  continuously monitored during the procedure by the interventional radiology nurse under my direct supervision. CONTRAST:  Isovue 300 approximately 60 cc. FLUOROSCOPY TIME:  Fluoroscopy Time: 17 minutes 48 seconds 989 mGy). COMPLICATIONS: None immediate. TECHNIQUE: Informed written consent was obtained from the patient after a thorough discussion of the procedural risks, benefits and alternatives. All questions were addressed. Maximal Sterile Barrier Technique was utilized including caps, mask, sterile gowns, sterile gloves, sterile drape, hand hygiene and skin antiseptic. A timeout was performed prior to the initiation of the procedure. The right forearm to the wrist was prepped and draped in the usual sterile fashion.  The right radial artery was identified, and its morphology documented with ultrasound. A dorsal palmar anastomosis was then verified to be present. Using ultrasound guidance, radial access was obtained via a micropuncture set. Over a 0.018 inch micro guidewire, a 4/5 French radial sheath was then inserted. The obturator and the micro guidewire were removed. Good aspiration obtained from the side port of the sheath. A cocktail of 2000 units of heparin, 2.5 mg of verapamil, and 200 mcg of nitroglycerin was then infused in diluted form without event. A right radial arteriogram was obtained. Over a 0.035 inch Roadrunner guidewire, a 5 [place] Simmons 2 diagnostic catheter was then advanced to the aortic arch region, and selectively positioned in the right vertebral artery, the right common carotid artery, the left common carotid artery and the left subclavian artery. After the procedure, hemostasis in the right radial puncture site was achieved using a wrist band. Distal right radial pulse was verified to be present. FINDINGS: The origin of the right vertebral artery is widely patent. The vessel is seen to opacify to the cranial skull base. Wide patency is seen of the vertebrobasilar junction. The right  vertebral artery terminates and the right posterior-inferior cerebellar artery and the right common carotid arteriogram demonstrates the right external carotid artery and its major branches to be widely patent. The right internal carotid artery at the bulb demonstrates near complete occlusion with a delayed string sign which ascends to the cranial skull base. More distally no reconstitution is seen of the right internal carotid artery from the external carotid artery branches. The left common carotid arteriogram demonstrates the left external carotid artery and its major branches to be widely patent. The left internal carotid artery at the bulb to the cranial skull base demonstrates wide patency. The petrous, the cavernous and the supraclinoid segments are widely patent with mild arteriosclerotic narrowing of the cavernous segments. The left middle cerebral artery and the left anterior cerebral artery opacify into the capillary and venous phases. Mild fusiform dilatation of the distal M1 segment of the left middle cerebral artery. There is partial reconstitution of the right anterior cerebral artery distribution in the A3 A4 regions from cross-filling from the distal pericallosal interhemispheric anastomosis. The left subclavian arteriogram demonstrates patency of the left vertebral artery. Mild atherosclerotic narrowing is seen of the proximal left vertebral artery. More distally, the vessel is seen to opacify to the cranial skull base. The left vertebrobasilar junction and the left posterior-inferior cerebellar artery opacify. The basilar artery, the posterior cerebral arteries, the superior cerebellar arteries and the anterior-inferior cerebellar arteries opacify into the capillary and venous phases. Prompt retrograde filling via the right posterior communicating artery of the supraclinoid right ICA is seen. More delayed images demonstrate opacification of the right middle cerebral artery and the right anterior  cerebral artery proximal A1 segment. Occlusion of the superior division of the right middle cerebral artery is seen. IMPRESSION: Near complete occlusion of the right internal carotid artery at the bulb with an angiographic string sign proximally. Reconstitution of the right middle cerebral artery and the right anterior cerebral A1 segment from the left vertebral artery via the right posterior communicating artery. PLAN: Findings reviewed with the patient and the patient's neurologist. Electronically Signed   By: Luanne Bras M.D.   On: 10/03/2019 15:53   IR ANGIO VERTEBRAL SEL VERTEBRAL UNI R MOD SED  Result Date: 10/07/2019 CLINICAL DATA:  Recent right hemispheric ischemic infarct related to proximal right internal carotid artery near complete occlusion. EXAM: BILATERAL  COMMON CAROTID AND INNOMINATE ANGIOGRAPHY; IR ANGIO VERTEBRAL SEL SUBCLAVIAN INNOMINATE UNI LEFT MOD SED; IR ANGIO VERTEBRAL SEL VERTEBRAL UNI RIGHT MOD SED; IR ULTRASOUND GUIDANCE VASC ACCESS RIGHT COMPARISON:  CTA of the head and neck of October 01, 2019. MEDICATIONS: Heparin 2000 units. No antibiotic was administered within 1 hour of the procedure. ANESTHESIA/SEDATION: Versed 1 mg IV; Fentanyl 50 mcg IV Moderate Sedation Time:  40 minutes The patient was continuously monitored during the procedure by the interventional radiology nurse under my direct supervision. CONTRAST:  Isovue 300 approximately 60 cc. FLUOROSCOPY TIME:  Fluoroscopy Time: 17 minutes 48 seconds 989 mGy). COMPLICATIONS: None immediate. TECHNIQUE: Informed written consent was obtained from the patient after a thorough discussion of the procedural risks, benefits and alternatives. All questions were addressed. Maximal Sterile Barrier Technique was utilized including caps, mask, sterile gowns, sterile gloves, sterile drape, hand hygiene and skin antiseptic. A timeout was performed prior to the initiation of the procedure. The right forearm to the wrist was prepped and draped  in the usual sterile fashion. The right radial artery was identified, and its morphology documented with ultrasound. A dorsal palmar anastomosis was then verified to be present. Using ultrasound guidance, radial access was obtained via a micropuncture set. Over a 0.018 inch micro guidewire, a 4/5 French radial sheath was then inserted. The obturator and the micro guidewire were removed. Good aspiration obtained from the side port of the sheath. A cocktail of 2000 units of heparin, 2.5 mg of verapamil, and 200 mcg of nitroglycerin was then infused in diluted form without event. A right radial arteriogram was obtained. Over a 0.035 inch Roadrunner guidewire, a 5 [place] Simmons 2 diagnostic catheter was then advanced to the aortic arch region, and selectively positioned in the right vertebral artery, the right common carotid artery, the left common carotid artery and the left subclavian artery. After the procedure, hemostasis in the right radial puncture site was achieved using a wrist band. Distal right radial pulse was verified to be present. FINDINGS: The origin of the right vertebral artery is widely patent. The vessel is seen to opacify to the cranial skull base. Wide patency is seen of the vertebrobasilar junction. The right vertebral artery terminates and the right posterior-inferior cerebellar artery and the right common carotid arteriogram demonstrates the right external carotid artery and its major branches to be widely patent. The right internal carotid artery at the bulb demonstrates near complete occlusion with a delayed string sign which ascends to the cranial skull base. More distally no reconstitution is seen of the right internal carotid artery from the external carotid artery branches. The left common carotid arteriogram demonstrates the left external carotid artery and its major branches to be widely patent. The left internal carotid artery at the bulb to the cranial skull base demonstrates wide  patency. The petrous, the cavernous and the supraclinoid segments are widely patent with mild arteriosclerotic narrowing of the cavernous segments. The left middle cerebral artery and the left anterior cerebral artery opacify into the capillary and venous phases. Mild fusiform dilatation of the distal M1 segment of the left middle cerebral artery. There is partial reconstitution of the right anterior cerebral artery distribution in the A3 A4 regions from cross-filling from the distal pericallosal interhemispheric anastomosis. The left subclavian arteriogram demonstrates patency of the left vertebral artery. Mild atherosclerotic narrowing is seen of the proximal left vertebral artery. More distally, the vessel is seen to opacify to the cranial skull base. The left vertebrobasilar junction and the left  posterior-inferior cerebellar artery opacify. The basilar artery, the posterior cerebral arteries, the superior cerebellar arteries and the anterior-inferior cerebellar arteries opacify into the capillary and venous phases. Prompt retrograde filling via the right posterior communicating artery of the supraclinoid right ICA is seen. More delayed images demonstrate opacification of the right middle cerebral artery and the right anterior cerebral artery proximal A1 segment. Occlusion of the superior division of the right middle cerebral artery is seen. IMPRESSION: Near complete occlusion of the right internal carotid artery at the bulb with an angiographic string sign proximally. Reconstitution of the right middle cerebral artery and the right anterior cerebral A1 segment from the left vertebral artery via the right posterior communicating artery. PLAN: Findings reviewed with the patient and the patient's neurologist. Electronically Signed   By: Luanne Bras M.D.   On: 10/03/2019 15:53    Labs:  CBC: Recent Labs    10/03/19 0505 10/04/19 0711 10/05/19 0012 10/07/19 1030  WBC 9.9 9.5 10.2 9.2  HGB 15.7  16.1 16.8 16.8  HCT 46.3 46.0 49.8 49.7  PLT 253 281 312 323    COAGS: Recent Labs    10/01/19 1227 10/05/19 0012  INR 1.0 1.0  APTT 29  --     BMP: Recent Labs    10/03/19 0505 10/04/19 0711 10/05/19 0012 10/07/19 1030  NA 139 137 136 135  K 3.8 3.8 4.0 4.2  CL 106 107 101 102  CO2 24 22 25  21*  GLUCOSE 135* 136* 146* 137*  BUN 8 11 14 14   CALCIUM 8.9 8.9 9.5 9.6  CREATININE 0.72 0.67 0.77 0.66  GFRNONAA >60 >60 >60 >60  GFRAA >60 >60 >60 >60    LIVER FUNCTION TESTS: Recent Labs    10/01/19 1227 10/05/19 0012  BILITOT 1.1 0.8  AST 14* 15  ALT 13 17  ALKPHOS 66 71  PROT 7.5 8.0  ALBUMIN 4.3 4.0     Assessment and Plan:  Right ICA severe stenosis. Plan for image-guided cerebral arteriogram with possible revascularization/angioplasty/stent placement of right ICA severe stenosis tentatively for tomorrow 10/08/2019 at 1200 in IR with Dr. Estanislado Pandy. Patient will be NPO at midnight. Afebrile and WBCs WNL. Ok to proceed with Plavix/Aspirin/Heparin use per Dr. Estanislado Pandy. Awaiting P2Y12 result. INR 1.0 today.  Risks and benefits of cerebral arteriogram with intervention were discussed with the patient including, but not limited to bleeding, infection, vascular injury, contrast induced renal failure, stroke, reperfusion hemorrhage, or even death. This interventional procedure involves the use of X-rays and because of the nature of the planned procedure, it is possible that we will have prolonged use of X-ray fluoroscopy. Potential radiation risks to you include (but are not limited to) the following: - A slightly elevated risk for cancer  several years later in life. This risk is typically less than 0.5% percent. This risk is low in comparison to the normal incidence of human cancer, which is 33% for women and 50% for men according to the [place]. - Radiation induced injury can include skin redness, resembling a rash, tissue breakdown / ulcers and hair  loss (which can be temporary or permanent).  The likelihood of either of these occurring depends on the difficulty of the procedure and whether you are sensitive to radiation due to previous procedures, disease, or genetic conditions.  IF your procedure requires a prolonged use of radiation, you will be notified and given written instructions for further action.  It is your responsibility to monitor the irradiated area for  the 2 weeks following the procedure and to notify your physician if you are concerned that you have suffered a radiation induced injury.   All of the patient's questions were answered, patient is agreeable to proceed. Consent obtained by patient's daughter due to confusion- signed and in IR control room.   Thank you for this interesting consult.  I greatly enjoyed meeting Malike Foglio and look forward to participating in their care.  A copy of this report was sent to the requesting provider on this date.  Electronically Signed: Earley Abide, PA-C 10/07/2019, 3:35 PM   I spent a total of 40 Minutes in face to face in clinical consultation, greater than 50% of which was counseling/coordinating care for right ICA severe stenosis/ revascularization.

## 2019-10-07 NOTE — ED Notes (Signed)
Changed pad under patient

## 2019-10-07 NOTE — Consult Note (Signed)
NEURO HOSPITALIST CONSULT NOTE   Requestig physician: David Hendrix  Reason for Consult: Cognitive changes in the setting of recent stroke  History obtained from:  Daughter and Chart    HPI:                                                                                                                                          David Hendrix is an 64 y.o. male re-presenting with worsened cognition over the weekend after he left the hospital on Friday prior to a scheduled right ICA stenting procedure. He had initially presented on Tuesday with a right frontal lobe stroke manifesting with left facial droop and dysphagia.   MRI brain on 4/27 had shown a 6.6 x 6.1 x 4.0 cm region of acute infarction affecting the right insula and right frontal operculum consistent with right MCA branch vessel infarction. Slow flow or no flow was noted in the right ICA at the skull base. A punctate acute infarction also was demonstrated in the medial right hypothalamus. There was a background pattern of moderate chronic small-vessel ischemic changes affecting the cerebral hemispheric white matter. Old right occipital cortical and subcortical infarctions were also noted. CTA confirmed a right internal carotid artery occlusion in the ICA bulb.  CTA of head and neck, 4/27:  -- Right internal carotid artery occlusion in the ICA bulb. -- Flow to the anterior circulation on the right occurs by patent anterior and posterior communicating arteries. No significant external to internal collateral flow seen. -- Apparent missing M2 to M3 branch serving the right frontal operculum infarction region.  Four vessel catheter-based angiogram, 4/29: -- Near complete occlusion of the right internal carotid artery at the bulb with an angiographic string sign proximally. -- Reconstitution of the right middle cerebral artery and the right anterior cerebral A1 segment from the left vertebral artery via the right  posterior communicating artery.  Echocardiogram on 4/28 revealed a left ventricular ejection fraction of 60 to 65%, with no regional wall motion abnormalities. Mild concentric left ventricular hypertrophy was noted and left ventricular diastolic parameters were consistent with Grade I diastolic dysfunction (impaired relaxation).   Discharge summary from Friday has been reviewed: "David Hendrix a 64 y.o.malewith medical history significant ofHTN andIIDM, but non-compliant with medicationsdue to insurance issue.Patient started to feel sick 3 days ago, with some trouble swallowing food and water think the food and water just slipped off the corner of his lips, but no significant coughing or choking no significant weakness at that point. Today, while in the laundromat patient was told by a bystander that he hasleft facial droop as sign of stroke and he was brought to the ER for evaluation. Patient denied any weakness of any of the limbs were numb or tingling of any of the  limbs, no blurred vision or hearing changes no headache."     Past Medical History:  Diagnosis Date  . Hypertension   . Stroke [facility])     Past Surgical History:  Procedure Laterality Date  . IR ANGIO INTRA EXTRACRAN SEL COM CAROTID INNOMINATE BILAT MOD SED  10/03/2019  . IR ANGIO VERTEBRAL SEL SUBCLAVIAN INNOMINATE UNI L MOD SED  10/03/2019  . IR ANGIO VERTEBRAL SEL VERTEBRAL UNI R MOD SED  10/03/2019  . IR US GUIDE VASC ACCESS RIGHT  10/03/2019    No family history on file.           Social History:  reports that he has been smoking. He has never used smokeless tobacco. He reports that he does not drink alcohol or use drugs.  No Known Allergies  HOME MEDICATIONS:                                                                                                                      No current facility-administered medications on file prior to encounter.   Current Outpatient Medications on File Prior to Encounter   Medication Sig Dispense Refill  . ACCU-CHEK GUIDE test strip 1 each by Other route in the morning, at noon, in the evening, and at bedtime.    . Accu-Chek Softclix Lancets lancets 1 each by Other route in the morning, at noon, in the evening, and at bedtime.    Marland Kitchen aspirin EC 81 MG tablet Take 1 tablet (81 mg total) by mouth daily. 150 tablet 0  . atorvastatin (LIPITOR) 40 MG tablet Take 1 tablet (40 mg total) by mouth daily. 30 tablet 0  . blood glucose meter kit and supplies KIT Dispense based on patient and insurance preference. Use up to four times daily as directed. (FOR ICD-9 250.00, 250.01). (Patient taking differently: 1 each by Other route See admin instructions. Dispense based on patient and insurance preference. Use up to four times daily as directed. (FOR ICD-9 250.00, 250.01).) 1 each 0  . clopidogrel (PLAVIX) 75 MG tablet Take 1 tablet (75 mg total) by mouth daily. 30 tablet 0  . lisinopril (ZESTRIL) 5 MG tablet Take 1 tablet (5 mg total) by mouth daily. 30 tablet 0  . metFORMIN (GLUCOPHAGE) 500 MG tablet Take 1 tablet (500 mg total) by mouth 2 (two) times daily with a meal. 60 tablet 0      ROS:  Unable to obtain detailed ROS due to patient's cognitive deficit.   Blood pressure (!) 151/81, pulse 60, temperature 98.1 F (36.7 C), temperature source Oral, resp. rate 16, height 5' 11"  (1.803 m), weight 100 kg, SpO2 98 %.   General Examination:                                                                                                       Physical Exam  HEENT-  [place]/AT  Lungs- Respirations unlabored Extremities- No edema  Neurological Examination Mental Status: Awake and alert. Oriented to year, month, city and state, but not day of the week. Able to spell WORLD correctly. Speech fluent with intact naming, repetition and comprehension for  basic commands, but could not correctly follow a 3 step command. Concreteness of thought. He is abulic, with sparse spontaneous verbal output/interaction.  Alert, oriented, thought content appropriate.  Speech fluent without evidence of aphasia.  Able to follow 3 step commands without difficulty. Cranial Nerves: II:  Visual fields intact with all 4 quadrants normal on individual testing. No extinction to DSS.   III,IV, VI: No ptosis. EOM are full with saccadic pursuits noted. No nystagmus.  V,VII: Left facial droop. Temp sensation equal bilaterally.  VIII: hearing intact to voice IX,X: No hypophonia XI: Head is midline.  XII: Midline tongue extension Motor: Right : Upper extremity   5/5    Left:     Upper extremity   5/5  Lower extremity   5/5     Lower extremity   5/5 No pronator drift.  Sensory: Temp and light touch intact throughout, bilaterally. No extinction to DSS.  Deep Tendon Reflexes: 1+ and symmetric throughout Plantars: Right: downgoing   Left: downgoing Cerebellar: No ataxia with FNF bilaterally Gait: Deferred   Lab Results: Basic Metabolic Panel: Recent Labs  Lab 10/01/19 1227 10/01/19 1227 10/03/19 0505 10/04/19 0711 10/05/19 0012  NA 135  --  139 137 136  K 4.0  --  3.8 3.8 4.0  CL 103  --  106 107 101  CO2 23  --  24 22 25   GLUCOSE 245*  --  135* 136* 146*  BUN 12  --  8 11 14   CREATININE 0.76  --  0.72 0.67 0.77  CALCIUM 9.1   < > 8.9 8.9 9.5  MG  --   --  2.2  --   --    < > = values in this interval not displayed.    CBC: Recent Labs  Lab 10/01/19 1227 10/03/19 0505 10/04/19 0711 10/05/19 0012  WBC 11.4* 9.9 9.5 10.2  NEUTROABS 7.8* 6.1 6.1 6.3  HGB 16.6 15.7 16.1 16.8  HCT 48.6 46.3 46.0 49.8  MCV 93.1 92.2 91.3 92.9  PLT 269 253 281 312    Cardiac Enzymes: No results for input(s): CKTOTAL, CKMB, CKMBINDEX, TROPONINI in the last 168 hours.  Lipid Panel: Recent Labs  Lab 10/02/19 0428  CHOL 199  TRIG 226*  HDL 22*  CHOLHDL 9.0   VLDL 45*  LDLCALC 132*    Imaging: No results found.  Assessment: 64 year old male re-presenting with worsened cognition post anterior right frontal lobe stroke last Tuesday.  1. Four vessel catheter-based angiogram on 4/29 had revealed near complete occlusion of the right internal carotid artery at the bulb with an angiographic string sign proximally. There was reconstitution of the right middle cerebral artery and the right anterior cerebral A1 segment from the left vertebral artery via the right posterior communicating artery. 2. He has evidence for cognitive impairment on exam. The findings are consistent with the location of the mid-anterior right frontal lobe infarction, which likely disrupts communicating fibers from the frontal pole and orbitofrontal cortex on the right. 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   Recommendatons: 1. Case discussed with Dr. Estanislado Pandy who also has been formally consulted. VIR anticipates possible stenting procedure tomorrow.  2. NPO after midnight.  3. P2Y12 platelet inhibition assay (ordered)  4. Continue atorvastatin, ASA and Plavix.    Electronically signed: Dr. Kerney Elbe 10/07/2019, 10:16 AM

## 2019-10-07 NOTE — ED Notes (Signed)
Dinner tray given to pt

## 2019-10-07 NOTE — ED Provider Notes (Addendum)
[facility] EMERGENCY DEPARTMENT Provider Note   CSN: [id number] Arrival date & time: 10/07/19  8937     History Chief Complaint  Patient presents with  . Carotid Stent Placement    David Hendrix is a 64 y.o. male.  HPI  64 yo male presents today complaining of ho stroke last week with occlusion of right carotid artery but refused intervention and was discharged.  Patient's daughter told to call Dr. Luanna Salk office today, but came to ED due to being told by nurses and physicians that he would be cared for faster if he came to the ED.  Patient with history of hypertension but was noncompliant.  Discharge Friday and daughter states she has been taking bp over weekend and sbp 150s-180s.  She also notes increased fogginess and confusion.  Last week with stroke left sided weakness.  Patient right handed and still able to walk.      Past Medical History:  Diagnosis Date  . Hypertension   . Stroke [facility])     Patient Active Problem List   Diagnosis Date Noted  . Essential hypertension   . Controlled type 2 diabetes mellitus with hyperglycemia, without long-term current use of insulin ([place])   . Noncompliance   . CVA (cerebral vascular accident) ([place]) 10/01/2019    History reviewed. No pertinent surgical history.     No family history on file.  Social History   Tobacco Use  . Smoking status: Current Every Day Smoker  . Smokeless tobacco: Never Used  Substance Use Topics  . Alcohol use: Never  . Drug use: Never    Home Medications Prior to Admission medications   Medication Sig Start Date End Date Taking? Authorizing Provider  aspirin EC 81 MG tablet Take 1 tablet (81 mg total) by mouth daily. 10/04/19 10/03/20  Florencia Reasons, MD  atorvastatin (LIPITOR) 40 MG tablet Take 1 tablet (40 mg total) by mouth daily. 10/05/19   Florencia Reasons, MD  blood glucose meter kit and supplies KIT Dispense based on patient and insurance preference. Use up to four times daily as  directed. (FOR ICD-9 250.00, 250.01). 10/04/19   Florencia Reasons, MD  clopidogrel (PLAVIX) 75 MG tablet Take 1 tablet (75 mg total) by mouth daily. 10/04/19 10/03/20  Florencia Reasons, MD  lisinopril (ZESTRIL) 5 MG tablet Take 1 tablet (5 mg total) by mouth daily. 10/04/19 10/03/20  Florencia Reasons, MD  metFORMIN (GLUCOPHAGE) 500 MG tablet Take 1 tablet (500 mg total) by mouth 2 (two) times daily with a meal. 10/04/19 10/03/20  Florencia Reasons, MD    Allergies    Patient has no known allergies.  Review of Systems   Review of Systems  Physical Exam Updated Vital Signs BP (!) 151/81 (BP Location: Right Arm)   Pulse 70   Temp 98.1 F (36.7 C) (Oral)   Resp 16   Ht 1.803 m (5' 11" )   Wt 100 kg   SpO2 97%   BMI 30.75 kg/m   Physical Exam Vitals and nursing note reviewed.  Constitutional:      Appearance: Normal appearance.  HENT:     Head: Normocephalic and atraumatic.     Right Ear: External ear normal.     Left Ear: External ear normal.     Nose: Nose normal.     Mouth/Throat:     Mouth: Mucous membranes are moist.  Eyes:     Extraocular Movements: Extraocular movements intact.  Cardiovascular:     Rate and Rhythm: Normal  rate and regular rhythm.     Heart sounds: Normal heart sounds.  Pulmonary:     Effort: Pulmonary effort is normal.     Breath sounds: Normal breath sounds.  Abdominal:     General: Abdomen is flat. Bowel sounds are normal.     Palpations: Abdomen is soft.  Musculoskeletal:        General: Normal range of motion.     Cervical back: Normal range of motion and neck supple.  Skin:    General: Skin is warm and dry.     Capillary Refill: Capillary refill takes less than 2 seconds.  Neurological:     Mental Status: He is alert.     Sensory: No sensory deficit.     Coordination: Coordination normal.     Deep Tendon Reflexes: Reflexes normal.     Comments: Patient alert and oriented to location, month, and self Left facial droop No palmar drift or leg drift noted Extraocular movements  are intact  Visual field deficit noted     ED Results / Procedures / Treatments   Labs (all labs ordered are listed, but only abnormal results are displayed) Labs Reviewed - No data to display  EKG None  Radiology No results found.  Procedures .Critical Care Performed by: Pattricia Boss, MD Authorized by: Pattricia Boss, MD   Critical care provider statement:    Critical care time (minutes):  45   Critical care end time:  10/07/2019 10:58 AM   Critical care was necessary to treat or prevent imminent or life-threatening deterioration of the following conditions:  CNS failure or compromise   Critical care was time spent personally by me on the following activities:  Discussions with consultants, evaluation of patient's response to treatment, examination of patient, ordering and performing treatments and interventions, ordering and review of laboratory studies, ordering and review of radiographic studies, pulse oximetry, re-evaluation of patient's condition, obtaining history from patient or surrogate and review of old charts   (including critical care time)  Medications Ordered in ED Medications - No data to display  ED Course  I have reviewed the triage vital signs and the nursing notes.  Pertinent labs & imaging results that were available during my care of the patient were reviewed by me and considered in my medical decision making (see chart for details).    MDM Rules/Calculators/A&P                     Discussed with interventional radiology Discussed with neurology Dr. Cheral Marker department and has evaluated patient.  He advises admission to hospitalist service Discussed with Dr. Lorin Mercy who will see for admission Final Clinical Impression(s) / ED Diagnoses Final diagnoses:  Carotid stenosis, right    Rx / DC Orders ED Discharge Orders    None       Pattricia Boss, MD 10/07/19 1029    Pattricia Boss, MD 10/07/19 1058

## 2019-10-07 NOTE — ED Notes (Signed)
Dr. Lacie Scotts at the bedside for evaluation. Pt to have lab draw of P2Y12. This information was handed off to Woodroe Chen, Charity fundraiser

## 2019-10-07 NOTE — H&P (Signed)
History and Physical    David Hendrix RWE:[id number] DOB: March 02, 1956 DOA: 10/07/2019  PCP: Patient, No Pcp Per Consultants:  None Patient coming from:  Home - lives alone, family is a few miles away and checks in multiple times a day; NOK: Daughter-in-law, Marzetta Board, or son, Keona Sheffler, [phone] - please contact before decisions are made!  Chief Complaint: Elective carotid stent placement  HPI: David Hendrix is a 64 y.o. male with medical history significant of HTN; DM; medication non-compliance; and large right frontal CVA on 4/27.  He was recommended to have IR place R ICA stent but declined to stay.  He returned today for "elective stent placement."  He was admitted last week, per his daughter.  R carotid stenosis.  Neurology and IR did angiogram, planned for balloon angioplasty and stent placement (supposed to be Friday at 1pm).  The patient refused (bladder control issue, had an external catheter and had incontinence; he panicked about cleanliness and wasn't allowed to take a shower and so refused the procedure).  He was sent home but family is concerned that the frontal lobe impairment may have contributed.  He is having difficulty finding his credit card, daughter reports increased irritability and executive function impairment.   ED Course:  CVA last week, refused carotid stent.  Neurology seeing.  Dr. Kathee Delton to take to IR tomorrow, NPO after MN.  Daughter thinks he should not have been discharged and allowed to make the decision to refuse.  Review of Systems: As per HPI; otherwise review of systems reviewed and negative.   Ambulatory Status:  Ambulates without assistance  COVID Vaccine Status:  None  Past Medical History:  Diagnosis Date  . Diabetes mellitus type 2 in obese ([place])   . Dyslipidemia   . Hypertension   . Obesity (BMI 30-39.9) 10/07/2019  . Stroke ([place]) 10/01/2019   large R frontal lobe CVA  . Tobacco dependence    none since 10/01/19    Past Surgical  History:  Procedure Laterality Date  . BACK SURGERY     x2, has hardware  . ELBOW SURGERY    . IR ANGIO INTRA EXTRACRAN SEL COM CAROTID INNOMINATE BILAT MOD SED  10/03/2019  . IR ANGIO VERTEBRAL SEL SUBCLAVIAN INNOMINATE UNI L MOD SED  10/03/2019  . IR ANGIO VERTEBRAL SEL VERTEBRAL UNI R MOD SED  10/03/2019  . IR US GUIDE VASC ACCESS RIGHT  10/03/2019    Social History   Socioeconomic History  . Marital status: Single    Spouse name: Not on file  . Number of children: Not on file  . Years of education: Not on file  . Highest education level: Not on file  Occupational History  . Not on file  Tobacco Use  . Smoking status: Former Smoker    Packs/day: 0.50    Years: 35.00    Pack years: 17.50    Quit date: 10/01/2019    Years since quitting: 0.0  . Smokeless tobacco: Never Used  Substance and Sexual Activity  . Alcohol use: Not Currently    Comment: remote h/o heavy use  . Drug use: Not Currently    Types: Methamphetamines, Cocaine, Marijuana  . Sexual activity: Not on file  Other Topics Concern  . Not on file  Social History Narrative  . Not on file   Social Determinants of Health   Financial Resource Strain:   . Difficulty of Paying Living Expenses:   Food Insecurity:   . Worried About Charity fundraiser in  the Last Year:   . [place] in the Last Year:   Transportation Needs:   . Film/video editor (Medical):   Marland Kitchen Lack of Transportation (Non-Medical):   Physical Activity:   . Days of Exercise per Week:   . Minutes of Exercise per Session:   Stress:   . Feeling of Stress :   Social Connections:   . Frequency of Communication with Friends and Family:   . Frequency of Social Gatherings with Friends and Family:   . Attends Religious Services:   . Active Member of Clubs or Organizations:   . Attends Archivist Meetings:   Marland Kitchen Marital Status:   Intimate Partner Violence:   . Fear of Current or Ex-Partner:   . Emotionally Abused:   Marland Kitchen Physically  Abused:   . Sexually Abused:     No Known Allergies  Family History  Problem Relation Age of Onset  . Dementia Mother   . Anxiety disorder Mother   . Cirrhosis Father   . Stroke Neg Hx     Prior to Admission medications   Medication Sig Start Date End Date Taking? Authorizing Provider  ACCU-CHEK GUIDE test strip 1 each by Other route in the morning, at noon, in the evening, and at bedtime. 10/04/19  Yes [provider]  Accu-Chek Softclix Lancets lancets 1 each by Other route in the morning, at noon, in the evening, and at bedtime. 10/04/19  Yes [provider]  aspirin EC 81 MG tablet Take 1 tablet (81 mg total) by mouth daily. 10/04/19 10/03/20 Yes Florencia Reasons, MD  atorvastatin (LIPITOR) 40 MG tablet Take 1 tablet (40 mg total) by mouth daily. 10/05/19  Yes Florencia Reasons, MD  blood glucose meter kit and supplies KIT Dispense based on patient and insurance preference. Use up to four times daily as directed. (FOR ICD-9 250.00, 250.01). Patient taking differently: 1 each by Other route See admin instructions. Dispense based on patient and insurance preference. Use up to four times daily as directed. (FOR ICD-9 250.00, 250.01). 10/04/19  Yes Florencia Reasons, MD  clopidogrel (PLAVIX) 75 MG tablet Take 1 tablet (75 mg total) by mouth daily. 10/04/19 10/03/20 Yes Florencia Reasons, MD  lisinopril (ZESTRIL) 5 MG tablet Take 1 tablet (5 mg total) by mouth daily. 10/04/19 10/03/20 Yes Florencia Reasons, MD  metFORMIN (GLUCOPHAGE) 500 MG tablet Take 1 tablet (500 mg total) by mouth 2 (two) times daily with a meal. 10/04/19 10/03/20 Yes Florencia Reasons, MD    Physical Exam: Vitals:   10/07/19 1330 10/07/19 1345 10/07/19 1400 10/07/19 1402  BP:   (!) 156/95 (!) 156/95  Pulse: 60 73 62   Resp: 16 18 18    Temp:      TempSrc:      SpO2: 97% 98% 96%   Weight:      Height:         . General:  Appears irritable and disgruntled but is in NAD . Eyes:  PERRL, EOMI, normal lids, iris . ENT:  grossly normal hearing, lips &  tongue, mmm; appropriate dentition . Neck:  no LAD, masses or thyromegaly; no carotid bruits . Cardiovascular:  RRR, no m/r/g. No LE edema.  Marland Kitchen Respiratory:   CTA bilaterally with no wheezes/rales/rhonchi.  Normal respiratory effort. . Abdomen:  soft, NT, ND, NABS . Skin:  no rash or induration seen on limited exam . Musculoskeletal:  grossly normal tone BUE/BLE, good ROM, no bony abnormality . Psychiatric:  cantankerous mood and  affect, speech fluent and mostly appropriate, AOx3 . Neurologic:  CN 2-12 grossly intact other than L facial droop, moves all extremities in coordinated fashion, sensation intact    Radiological Exams on Admission: No results found.  EKG: Independently reviewed.  NSR with rate 59; nonspecific ST changes with no evidence of acute ischemia   Labs on Admission: I have personally reviewed the available labs and imaging studies at the time of the admission.  Pertinent labs:   Glucose 146 Normal CBC INR 1.0   Assessment/Plan Principal Problem:   Carotid stenosis, symptomatic, with infarction [facility]) Active Problems:   CVA (cerebral vascular accident) ([place])   Essential hypertension   Controlled type 2 diabetes mellitus with hyperglycemia, without long-term current use of insulin ([place])   Noncompliance   Dyslipidemia   Obesity (BMI 30-39.9)   Tobacco dependence    Symptomatic carotid stenosis with recent (4/27) R frontal lobe CA -Patient with recent CVA -CTA showed R ICA occlusion -Angiogram was performed on 4/29 which confirmed near complete carotid occlusion -He was recommended to have IR revascularization procedure but became upset and refused the procedure and so was discharged (there is some concern by his daughter that his frontal lobe impairment led to poor judgment; she has requested that family be consulted prior to patient declining services on an ongoing basis and this seems reasonable) -He was discharged on ASA 81 + Plavix (although there is some  discrepancy about whether it should be 81 mg ASA or 325 mg) -He returned to the ER the following day (5/1) for this "elective procedure" but declined to wait in the ER -He returned today, again for this "elective procedure" -Given the significant stenosis in conjunction with his recent CVA, admission does appear to be warranted so that this procedure can be performed -Will admit to telemetry -NPO after midnight in anticipation of arteriogram/revascularization/angioplasty/stent placement tomorrow at 1200 -Continue ASA and Plavix for now, as well as SQ Heparin for DVT prevention -He has ongoing frontal lobe impairment and will need assistance with executive functioning  HTN -Continue Lisinopril  DM -Recent A1c was 7.6, indicating suboptimal control  HLD -Continue Lipitor -Recent lipids (4/28): 199/22/132/226  Noncompliance -It does not appear that the patient was taking medications prior to his last hospitalization -Ongoing encouragement at compliance will be important on an ongoing basis -Patient/family have requested chaplain consult for assistance in creating ACP documents  Obesity Body mass index is 30.75 kg/m. -Weight loss should be encouraged  Tobacco dependence -Reportedly quit at the time of CVA and family has not allowed him access since d/c -Encourage ongoing cessation.   -This was discussed with the patient and should be reviewed on an ongoing basis.   -Patch ordered at patient request. -Had 5 mm pulmonary nodule noted during last hospitalization; needs f/u in 1 year  Urinary incontinence -Since the CVA, he is having urgency and incontinence -He feels very unclean when this happens -Can try condom cath or PureWyck at patient's discretion -Can remove tele to shower   Note: This patient has been tested and is negative for the novel coronavirus COVID-19.  DVT prophylaxis: Heparin Code Status:  Full - confirmed with patient/family Family Communication:  Daughter-in-law was present throughout evaluation Disposition Plan:  The patient is from: home  Anticipated d/c is to: home without [facility] services  Anticipated d/c date will depend on clinical response to treatment, likely 2-3 days  Patient is currently: acutely ill Consults called: Neurology; neurointerventional radiology Admission status:  Admit - It is  my clinical opinion that admission to INPATIENT is reasonable and necessary because of the expectation that this patient will require hospital care that crosses at least 2 midnights to treat this condition based on the medical complexity of the problems presented.  Given the aforementioned information, the predictability of an adverse outcome is felt to be significant.    Karmen Bongo MD [facility]   How to contact the [facility] Attending or Consulting provider [place] or covering provider during after hours [place], for this patient?  1. Check the care team in [facility] and look for a) attending/consulting [facility] provider listed and b) the [facility] team listed 2. Log into [email or web address] and use [place] universal password to access. If you do not have the password, please contact the hospital operator. 3. Locate the [facility] provider you are looking for under [facility] and page to a number that you can be directly reached. 4. If you still have difficulty reaching the provider, please page the [facility] (Director on Call) for the Hospitalists listed on amion for assistance.   10/07/2019, 3:51 PM

## 2019-10-08 ENCOUNTER — Inpatient Hospital Stay (HOSPITAL_COMMUNITY): Payer: Medicare Other | Admitting: Certified Registered"

## 2019-10-08 ENCOUNTER — Encounter (HOSPITAL_COMMUNITY)
Admission: EM | Disposition: A | Discharge status: Home / Self Care | Payer: Self-pay | Source: Home / Self Care | Attending: Internal Medicine

## 2019-10-08 ENCOUNTER — Encounter (HOSPITAL_COMMUNITY): Payer: Self-pay | Admitting: Internal Medicine

## 2019-10-08 ENCOUNTER — Inpatient Hospital Stay (HOSPITAL_COMMUNITY): Payer: Medicare Other

## 2019-10-08 DIAGNOSIS — I6521 Occlusion and stenosis of right carotid artery: Secondary | ICD-10-CM | POA: Diagnosis present

## 2019-10-08 HISTORY — PX: IR CT HEAD LTD: IMG2386

## 2019-10-08 HISTORY — PX: IR PTA NON CORO-LOWER EXTREM: IMG6142

## 2019-10-08 HISTORY — PX: IR PERCUTANEOUS ART THROMBECTOMY/INFUSION INTRACRANIAL INC DIAG ANGIO: IMG6087

## 2019-10-08 HISTORY — PX: RADIOLOGY WITH ANESTHESIA: SHX6223

## 2019-10-08 HISTORY — PX: IR ENDOVASC INTRACRANIAL INF OTHER THAN THROMBO ART INC DIAG ANGIO: IMG6088

## 2019-10-08 LAB — MRSA PCR SCREENING: MRSA by PCR: NEGATIVE

## 2019-10-08 LAB — GLUCOSE, CAPILLARY
Glucose-Capillary: 119 mg/dL — ABNORMAL HIGH (ref 70–99)
Glucose-Capillary: 134 mg/dL — ABNORMAL HIGH (ref 70–99)
Glucose-Capillary: 115 mg/dL — ABNORMAL HIGH (ref 70–99)
Glucose-Capillary: 138 mg/dL — ABNORMAL HIGH (ref 70–99)
Glucose-Capillary: 168 mg/dL — ABNORMAL HIGH (ref 70–99)
Glucose-Capillary: 173 mg/dL — ABNORMAL HIGH (ref 70–99)
Glucose-Capillary: 202 mg/dL — ABNORMAL HIGH (ref 70–99)

## 2019-10-08 LAB — POCT ACTIVATED CLOTTING TIME: Activated Clotting Time: 169 seconds

## 2019-10-08 SURGERY — IR WITH ANESTHESIA
Anesthesia: General

## 2019-10-08 MED ORDER — LIDOCAINE 2% (20 MG/ML) 5 ML SYRINGE
INTRAMUSCULAR | Status: DC | PRN
  Administered 2019-10-08: 100 mg via INTRAVENOUS

## 2019-10-08 MED ORDER — CEFAZOLIN SODIUM-DEXTROSE 2-4 GM/100ML-% IV SOLN
INTRAVENOUS | Status: AC
  Filled 2019-10-08: qty 100

## 2019-10-08 MED ORDER — CLOPIDOGREL BISULFATE 75 MG PO TABS
75.0000 mg | ORAL_TABLET | Freq: Once | ORAL | Status: AC
  Administered 2019-10-08: 75 mg via ORAL
  Filled 2019-10-08: qty 1

## 2019-10-08 MED ORDER — HYDROMORPHONE HCL 1 MG/ML IJ SOLN: 0.2500 mg | INTRAMUSCULAR | Status: DC | PRN

## 2019-10-08 MED ORDER — EPTIFIBATIDE 20 MG/10ML IV SOLN
INTRAVENOUS | Status: AC
  Filled 2019-10-08: qty 10

## 2019-10-08 MED ORDER — SODIUM CHLORIDE 0.9 % IV SOLN: INTRAVENOUS | Status: DC

## 2019-10-08 MED ORDER — PHENYLEPHRINE HCL-NACL 10-0.9 MG/250ML-% IV SOLN
INTRAVENOUS | Status: DC | PRN
Start: 2019-10-08 — End: 2019-10-08
  Administered 2019-10-08: 25 ug/min via INTRAVENOUS

## 2019-10-08 MED ORDER — FENTANYL CITRATE (PF) 100 MCG/2ML IJ SOLN
INTRAMUSCULAR | Status: DC | PRN
  Administered 2019-10-08: 100 ug via INTRAVENOUS

## 2019-10-08 MED ORDER — LACTATED RINGERS IV SOLN: INTRAVENOUS | Status: DC | PRN

## 2019-10-08 MED ORDER — EPTIFIBATIDE 20 MG/10ML IV SOLN
INTRAVENOUS | Status: DC | PRN
  Administered 2019-10-08 (×5): 1.5 mg

## 2019-10-08 MED ORDER — DEXAMETHASONE SODIUM PHOSPHATE 10 MG/ML IJ SOLN
INTRAMUSCULAR | Status: DC | PRN
  Administered 2019-10-08: 10 mg via INTRAVENOUS

## 2019-10-08 MED ORDER — ONDANSETRON HCL 4 MG/2ML IJ SOLN
INTRAMUSCULAR | Status: DC | PRN
  Administered 2019-10-08: 4 mg via INTRAVENOUS

## 2019-10-08 MED ORDER — CLEVIDIPINE BUTYRATE 0.5 MG/ML IV EMUL
INTRAVENOUS | Status: AC
  Filled 2019-10-08: qty 50

## 2019-10-08 MED ORDER — IOHEXOL 300 MG/ML  SOLN
150.0000 mL | Freq: Once | INTRAMUSCULAR | Status: AC | PRN
  Administered 2019-10-08: 75 mL via INTRA_ARTERIAL

## 2019-10-08 MED ORDER — ONDANSETRON HCL 4 MG/2ML IJ SOLN: 4.0000 mg | Freq: Four times a day (QID) | INTRAMUSCULAR | Status: DC | PRN

## 2019-10-08 MED ORDER — IOHEXOL 300 MG/ML  SOLN
150.0000 mL | Freq: Once | INTRAMUSCULAR | Status: AC | PRN
  Administered 2019-10-08: 25 mL via INTRA_ARTERIAL

## 2019-10-08 MED ORDER — GLYCOPYRROLATE PF 0.2 MG/ML IJ SOSY
PREFILLED_SYRINGE | INTRAMUSCULAR | Status: DC | PRN
  Administered 2019-10-08: .2 mg via INTRAVENOUS

## 2019-10-08 MED ORDER — ROCURONIUM BROMIDE 10 MG/ML (PF) SYRINGE
PREFILLED_SYRINGE | INTRAVENOUS | Status: DC | PRN
  Administered 2019-10-08 (×2): 20 mg via INTRAVENOUS
  Administered 2019-10-08: 100 mg via INTRAVENOUS

## 2019-10-08 MED ORDER — PROPOFOL 10 MG/ML IV BOLUS
INTRAVENOUS | Status: DC | PRN
  Administered 2019-10-08: 150 mg via INTRAVENOUS
  Administered 2019-10-08: 50 mg via INTRAVENOUS

## 2019-10-08 MED ORDER — SUGAMMADEX SODIUM 200 MG/2ML IV SOLN
INTRAVENOUS | Status: DC | PRN
  Administered 2019-10-08: 200 mg via INTRAVENOUS

## 2019-10-08 MED ORDER — ONDANSETRON HCL 4 MG/2ML IJ SOLN: 4.0000 mg | Freq: Once | INTRAMUSCULAR | Status: DC | PRN

## 2019-10-08 MED ORDER — CLEVIDIPINE BUTYRATE 0.5 MG/ML IV EMUL
0.0000 mg/h | INTRAVENOUS | Status: DC
  Administered 2019-10-08: 16 mg/h via INTRAVENOUS
  Administered 2019-10-08 (×2): 21 mg/h via INTRAVENOUS
  Administered 2019-10-08: 2 mg/h via INTRAVENOUS
  Administered 2019-10-08: 21 mg/h via INTRAVENOUS
  Administered 2019-10-08: 10 mg/h via INTRAVENOUS
  Administered 2019-10-08: 20 mg/h via INTRAVENOUS
  Administered 2019-10-09 (×3): 21 mg/h via INTRAVENOUS
  Administered 2019-10-09: 20 mg/h via INTRAVENOUS
  Administered 2019-10-09: 19 mg/h via INTRAVENOUS
  Administered 2019-10-09: 20 mg/h via INTRAVENOUS
  Administered 2019-10-09: 21 mg/h via INTRAVENOUS
  Filled 2019-10-08 (×13): qty 50

## 2019-10-08 MED ORDER — ACETAMINOPHEN 160 MG/5ML PO SOLN: 650.0000 mg | ORAL | Status: DC | PRN

## 2019-10-08 MED ORDER — CEFAZOLIN SODIUM-DEXTROSE 2-3 GM-%(50ML) IV SOLR
INTRAVENOUS | Status: DC | PRN
  Administered 2019-10-08: 2 g via INTRAVENOUS

## 2019-10-08 MED ORDER — ACETAMINOPHEN 325 MG PO TABS: 650.0000 mg | ORAL_TABLET | ORAL | Status: DC | PRN

## 2019-10-08 MED ORDER — ESMOLOL HCL 100 MG/10ML IV SOLN
INTRAVENOUS | Status: DC | PRN
  Administered 2019-10-08: 20 mg via INTRAVENOUS
  Administered 2019-10-08: 30 mg via INTRAVENOUS
  Administered 2019-10-08: 20 mg via INTRAVENOUS
  Administered 2019-10-08: 30 mg via INTRAVENOUS

## 2019-10-08 MED ORDER — ACETAMINOPHEN 650 MG RE SUPP: 650.0000 mg | RECTAL | Status: DC | PRN

## 2019-10-08 MED ORDER — MEPERIDINE HCL 25 MG/ML IJ SOLN: 6.2500 mg | INTRAMUSCULAR | Status: DC | PRN

## 2019-10-08 MED ORDER — NITROGLYCERIN 1 MG/10 ML FOR IR/CATH LAB
INTRA_ARTERIAL | Status: AC
  Filled 2019-10-08: qty 10

## 2019-10-08 MED ORDER — HEPARIN SODIUM (PORCINE) 1000 UNIT/ML IJ SOLN
INTRAMUSCULAR | Status: DC | PRN
Start: 2019-10-08 — End: 2019-10-08
  Administered 2019-10-08: 1000 [IU] via INTRAVENOUS

## 2019-10-08 MED ORDER — PHENYLEPHRINE 40 MCG/ML (10ML) SYRINGE FOR IV PUSH (FOR BLOOD PRESSURE SUPPORT)
PREFILLED_SYRINGE | INTRAVENOUS | Status: DC | PRN
  Administered 2019-10-08 (×2): 80 ug via INTRAVENOUS
  Administered 2019-10-08: 40 ug via INTRAVENOUS
  Administered 2019-10-08 (×2): 80 ug via INTRAVENOUS

## 2019-10-08 NOTE — Procedures (Signed)
S/P bilateral common carotid and Lt Vert angiograms followed by x 1 balloon angioplasty of prox RT ICA  and x 1 pass with a 36mm x 40 mm solitaire X  and penumbra aspiration with revascularization.  However continued occlusion of cavernous seg unchanged.Marland Kitchen S.Darrius Montano MD. CT post treatment  Shows no evidence of ICH or mass effect. RT CFA puncture closed with an 8 F angioseal closure device.   Distal pulses  Rt DP dopplerable . RTT PT and LT DP and PT palpable unchanged.  Patient extubated. Maintaining O2 sats. Denies H/As or N/V  Pupils 2 to 3 mm RT = LT sluggish. Mild LT UE pronation drift and a 4+ /5 Lt hand grip. Rest 5/5  Admitting service notified. S.Saintclair Schroader MD

## 2019-10-08 NOTE — Progress Notes (Signed)
SLP Cancellation Note  Patient Details Name: David Hendrix MRN: [id number] DOB: [birth year]   Cancelled treatment:        Attempted to see for cognitive-language assessment. Pt currently out of room in IR. Will continue efforts- likely will be tomorrow until we attempt again.   Royce Macadamia 10/08/2019, 11:57 AM  Breck Coons Lonell Face.Ed Nurse, children's [phone] Office [phone]

## 2019-10-08 NOTE — Anesthesia Postprocedure Evaluation (Signed)
Anesthesia Post Note  Patient: David Hendrix  Procedure(s) Performed: CAROTID STENT PLACEMENT IR WITH ANESTHESIA (N/A )     Patient location during evaluation: PACU Anesthesia Type: General Level of consciousness: sedated and patient cooperative Pain management: pain level controlled Vital Signs Assessment: post-procedure vital signs reviewed and stable Respiratory status: spontaneous breathing Cardiovascular status: stable Anesthetic complications: no    Last Vitals:  Vitals:   10/08/19 2100 10/08/19 2115  BP: (!) 130/59 124/64  Pulse: 76 75  Resp: 13 13  Temp:    SpO2: 97% 95%    Last Pain:  Vitals:   10/08/19 2000  TempSrc: Axillary  PainSc: Asleep                 Lewie Loron

## 2019-10-08 NOTE — Progress Notes (Signed)
NIR Brief Note:  Patient is scheduled for an image-guided cerebral arteriogram with possible revascularization/angioplasty/stent placement of right ICA severe stenosis tentatively for today at 1200 in IR with Dr. Corliss Skains.  P2Y12 71 PRU today. Discussed with Dr. Corliss Skains who recommends patient receive Plavix 150 mg today. Order placed, Marchelle Folks, RN aware. Plan to proceed with procedure today.  NIR to follow.   David Boga Alton Tremblay, PA-C 10/08/2019, 8:33 AM

## 2019-10-08 NOTE — Progress Notes (Signed)
Patient ID: David Hendrix, male   DOB: 02/02/1956, 64 y.o.   MRN: [id number] INR. Patient reports new neurological symptoms or signs. Denies any H/As<N/V or visual changes. Speech and motor function remain intact. O E alert. Awake. Oriented to place and month. Mild Lt facial droop. No pronation drift of outstretched hands. Patient expressed awareness and understanding of the planned treatment. Understands potential inability to revascularize and of a  possible new stroke , intracranial hemorrhage and death.. D/W daughter over the phone also. Informed consent obtained. S.Payden Docter MD

## 2019-10-08 NOTE — Anesthesia Preprocedure Evaluation (Signed)
Anesthesia Evaluation  Patient identified by MRN, date of birth, ID band Patient awake    Reviewed: Allergy & Precautions, NPO status , Patient's Chart, lab work & pertinent test results  Airway Mallampati: I  TM Distance: >3 FB Neck ROM: Full    Dental   Pulmonary former smoker,    Pulmonary exam normal        Cardiovascular hypertension, Pt. on medications Normal cardiovascular exam     Neuro/Psych CVA    GI/Hepatic   Endo/Other  diabetes, Type 2  Renal/GU      Musculoskeletal   Abdominal   Peds  Hematology   Anesthesia Other Findings   Reproductive/Obstetrics                             Anesthesia Physical Anesthesia Plan  ASA: III  Anesthesia Plan: General   Post-op Pain Management:    Induction: Intravenous  PONV Risk Score and Plan: 2 and Ondansetron and Midazolam  Airway Management Planned: Oral ETT  Additional Equipment: Arterial line  Intra-op Plan:   Post-operative Plan: Extubation in OR  Informed Consent: I have reviewed the patients History and Physical, chart, labs and discussed the procedure including the risks, benefits and alternatives for the proposed anesthesia with the patient or authorized representative who has indicated his/her understanding and acceptance.       Plan Discussed with: CRNA and Surgeon  Anesthesia Plan Comments:         Anesthesia Quick Evaluation

## 2019-10-08 NOTE — Progress Notes (Signed)
Progress Note    David Hendrix  UJW:[id number] DOB: 1955-06-23  DOA: 10/07/2019 PCP: Patient, No Pcp Per    Brief Narrative:     Medical records reviewed and are as summarized below:  David Hendrix is an 64 y.o. male with medical history significant of HTN; DM; medication non-compliance; and large right frontal CVA on 4/27.  He was recommended to have IR place R ICA stent but declined to stay.  He returned today for "elective stent placement."  He was admitted last week, per his daughter.  R carotid stenosis.  Neurology and IR did angiogram, planned for balloon angioplasty and stent placement (supposed to be Friday at 1pm).  The patient refused (bladder control issue, had an external catheter and had incontinence; he panicked about cleanliness and wasn't allowed to take a shower and so refused the procedure).  He was sent home but family is concerned that the frontal lobe impairment may have contributed.  He is having difficulty finding his credit card, daughter reports increased irritability and executive function impairment.  Assessment/Plan:   Principal Problem:   Carotid stenosis, symptomatic, with infarction [facility]) Active Problems:   CVA (cerebral vascular accident) ([place])   Essential hypertension   Controlled type 2 diabetes mellitus with hyperglycemia, without long-term current use of insulin ([place])   Noncompliance   Dyslipidemia   Obesity (BMI 30-39.9)   Tobacco dependence   Symptomatic carotid stenosis with recent (4/27) R frontal lobe CA -Patient with recent CVA -CTA showed R ICA occlusion -Angiogram was performed on 4/29 which confirmed near complete carotid occlusion -He was recommended to have IR revascularization procedure but became upset and refused the procedure and so was discharged (there is some concern by his daughter that his frontal lobe impairment led to poor judgment; she has requested that family be consulted prior to patient declining services on an  ongoing basis and this seems reasonable) -He was discharged on ASA 81 + Plavix (although there is some discrepancy about whether it should be 81 mg ASA or 325 mg) -He returned to the ER the following day (5/1) for this "elective procedure" but declined to wait in the ER -Patient is scheduled for an image-guided cerebral arteriogram with possible revascularization/angioplasty/stent placement of right ICA severe stenosis  HTN -Continue Lisinopril  DM -Recent A1c was 7.6, indicating suboptimal control  HLD -Continue Lipitor -Recent lipids (4/28): 199/22/132/226  Noncompliance -It does not appear that the patient was taking medications prior to his last hospitalization -Ongoing encouragement at compliance will be important on an ongoing basis  Tobacco dependence -Continued cessation encouraged -Had 5 mm pulmonary nodule noted during last hospitalization; needs f/u in 1 year  Urinary incontinence -Since the CVA, he is having urgency and incontinence -He feels very unclean when this happens -Can try condom cath or PureWyck at patient's discretion -Can remove tele to shower  obesity Body mass index is 30.75 kg/m.   Family Communication/Anticipated D/C date and plan/Code Status   DVT prophylaxis: Lovenox ordered. Code Status: Full Code.  Family Communication: Daughter at bedside Disposition Plan: Status is: Inpatient  Remains inpatient appropriate because:Inpatient level of care appropriate due to severity of illness   Dispo: The patient is from: Home              Anticipated d/c is to: Home              Anticipated d/c date is: 2 days  Patient currently is not medically stable to d/c.    Medical Consultants:    IR  Subjective:   Patient has no questions regarding upcoming procedure and has already met with Dr. Estanislado Pandy  Objective:    Vitals:   10/07/19 2219 10/07/19 2332 10/08/19 0331 10/08/19 0826  BP: 137/68 (!) 147/71 129/76 (!) 144/66    Pulse: 61 (!) 59 (!) 55 (!) 57  Resp:  18 17 18   Temp: 97.6 F (36.4 C) 98 F (36.7 C) 98 F (36.7 C) (!) 97.2 F (36.2 C)  TempSrc: Oral   Oral  SpO2: 98% 98% 99% 98%  Weight:      Height:        Intake/Output Summary (Last 24 hours) at 10/08/2019 1128 Last data filed at 10/08/2019 1000 Gross per 24 hour  Intake 1237.33 ml  Output --  Net 1237.33 ml   Filed Weights   10/07/19 0623  Weight: 100 kg    Exam: In bed, irritable but cooperative Regular rate and rhythm No increased work of breathing, no wheezing Skin dry and rough feeling  Data Reviewed:   I have personally reviewed following labs and imaging studies:  Labs: Labs show the following:   Basic Metabolic Panel: Recent Labs  Lab 10/01/19 1227 10/01/19 1227 10/03/19 0505 10/03/19 0505 10/04/19 0711 10/04/19 0711 10/05/19 0012 10/07/19 1030  NA 135  --  139  --  137  --  136 135  K 4.0   < > 3.8   < > 3.8   < > 4.0 4.2  CL 103  --  106  --  107  --  101 102  CO2 23  --  24  --  22  --  25 21*  GLUCOSE 245*  --  135*  --  136*  --  146* 137*  BUN 12  --  8  --  11  --  14 14  CREATININE 0.76  --  0.72  --  0.67  --  0.77 0.66  CALCIUM 9.1  --  8.9  --  8.9  --  9.5 9.6  MG  --   --  2.2  --   --   --   --   --    < > = values in this interval not displayed.   GFR Estimated Creatinine Clearance: 110.9 mL/min (by C-G formula based on SCr of 0.66 mg/dL). Liver Function Tests: Recent Labs  Lab 10/01/19 1227 10/05/19 0012  AST 14* 15  ALT 13 17  ALKPHOS 66 71  BILITOT 1.1 0.8  PROT 7.5 8.0  ALBUMIN 4.3 4.0   No results for input(s): LIPASE, AMYLASE in the last 168 hours. No results for input(s): AMMONIA in the last 168 hours. Coagulation profile Recent Labs  Lab 10/01/19 1227 10/05/19 0012  INR 1.0 1.0    CBC: Recent Labs  Lab 10/01/19 1227 10/03/19 0505 10/04/19 0711 10/05/19 0012 10/07/19 1030  WBC 11.4* 9.9 9.5 10.2 9.2  NEUTROABS 7.8* 6.1 6.1 6.3  --   HGB 16.6 15.7 16.1  16.8 16.8  HCT 48.6 46.3 46.0 49.8 49.7  MCV 93.1 92.2 91.3 92.9 94.3  PLT 269 253 281 312 323   Cardiac Enzymes: No results for input(s): CKTOTAL, CKMB, CKMBINDEX, TROPONINI in the last 168 hours. BNP (last 3 results) No results for input(s): PROBNP in the last 8760 hours. CBG: Recent Labs  Lab 10/04/19 1539 10/07/19 2025 10/07/19 2246 10/08/19 0617 10/08/19 0736  GLUCAP 156*  141* 100* 119* 134*   D-Dimer: No results for input(s): DDIMER in the last 72 hours. Hgb A1c: No results for input(s): HGBA1C in the last 72 hours. Lipid Profile: No results for input(s): CHOL, HDL, LDLCALC, TRIG, CHOLHDL, LDLDIRECT in the last 72 hours. Thyroid function studies: No results for input(s): TSH, T4TOTAL, T3FREE, THYROIDAB in the last 72 hours.  Invalid input(s): FREET3 Anemia work up: No results for input(s): VITAMINB12, FOLATE, FERRITIN, TIBC, IRON, RETICCTPCT in the last 72 hours. Sepsis Labs: Recent Labs  Lab 10/03/19 0505 10/04/19 0711 10/05/19 0012 10/07/19 1030  WBC 9.9 9.5 10.2 9.2    Microbiology Recent Results (from the past 240 hour(s))  Respiratory Panel by RT PCR (Flu A&B, Covid) - Nasopharyngeal Swab     Status: None   Collection Time: 10/01/19  4:30 PM   Specimen: Nasopharyngeal Swab  Result Value Ref Range Status   SARS Coronavirus 2 by RT PCR NEGATIVE NEGATIVE Final    Comment: (NOTE) SARS-CoV-2 target nucleic acids are NOT DETECTED. The SARS-CoV-2 RNA is generally detectable in upper respiratoy specimens during the acute phase of infection. The lowest concentration of SARS-CoV-2 viral copies this assay can detect is 131 copies/mL. A negative result does not preclude SARS-Cov-2 infection and should not be used as the sole basis for treatment or other patient management decisions. A negative result may occur with  improper specimen collection/handling, submission of specimen other than nasopharyngeal swab, presence of viral mutation(s) within the areas  targeted by this assay, and inadequate number of viral copies (<131 copies/mL). A negative result must be combined with clinical observations, patient history, and epidemiological information. The expected result is Negative. Fact Sheet for Patients:  PinkCheek.be Fact Sheet for Healthcare Providers:  GravelBags.it This test is not yet ap proved or cleared by the [place] FDA and  has been authorized for detection and/or diagnosis of SARS-CoV-2 by FDA under an Emergency Use Authorization (EUA). This EUA will remain  in effect (meaning this test can be used) for the duration of the COVID-19 declaration under Section 564(b)(1) of the Act, 21 U.S.C. section 360bbb-3(b)(1), unless the authorization is terminated or revoked sooner.    Influenza A by PCR NEGATIVE NEGATIVE Final   Influenza B by PCR NEGATIVE NEGATIVE Final    Comment: (NOTE) The Xpert Xpress SARS-CoV-2/FLU/RSV assay is intended as an aid in  the diagnosis of influenza from Nasopharyngeal swab specimens and  should not be used as a sole basis for treatment. Nasal washings and  aspirates are unacceptable for Xpert Xpress SARS-CoV-2/FLU/RSV  testing. Fact Sheet for Patients: PinkCheek.be Fact Sheet for Healthcare Providers: GravelBags.it This test is not yet approved or cleared by the [place] FDA and  has been authorized for detection and/or diagnosis of SARS-CoV-2 by  FDA under an Emergency Use Authorization (EUA). This EUA will remain  in effect (meaning this test can be used) for the duration of the  Covid-19 declaration under Section 564(b)(1) of the Act, 21  U.S.C. section 360bbb-3(b)(1), unless the authorization is  terminated or revoked. Performed at [facility], [place] [address], [place], [place] [address]   Respiratory Panel by RT PCR (Flu A&B, Covid) - Nasopharyngeal Swab     Status:  None   Collection Time: 10/07/19 10:57 AM   Specimen: Nasopharyngeal Swab  Result Value Ref Range Status   SARS Coronavirus 2 by RT PCR NEGATIVE NEGATIVE Final    Comment: (NOTE) SARS-CoV-2 target nucleic acids are NOT DETECTED. The SARS-CoV-2 RNA is  generally detectable in upper respiratoy specimens during the acute phase of infection. The lowest concentration of SARS-CoV-2 viral copies this assay can detect is 131 copies/mL. A negative result does not preclude SARS-Cov-2 infection and should not be used as the sole basis for treatment or other patient management decisions. A negative result may occur with  improper specimen collection/handling, submission of specimen other than nasopharyngeal swab, presence of viral mutation(s) within the areas targeted by this assay, and inadequate number of viral copies (<131 copies/mL). A negative result must be combined with clinical observations, patient history, and epidemiological information. The expected result is Negative. Fact Sheet for Patients:  PinkCheek.be Fact Sheet for Healthcare Providers:  GravelBags.it This test is not yet ap proved or cleared by the [place] FDA and  has been authorized for detection and/or diagnosis of SARS-CoV-2 by FDA under an Emergency Use Authorization (EUA). This EUA will remain  in effect (meaning this test can be used) for the duration of the COVID-19 declaration under Section 564(b)(1) of the Act, 21 U.S.C. section 360bbb-3(b)(1), unless the authorization is terminated or revoked sooner.    Influenza A by PCR NEGATIVE NEGATIVE Final   Influenza B by PCR NEGATIVE NEGATIVE Final    Comment: (NOTE) The Xpert Xpress SARS-CoV-2/FLU/RSV assay is intended as an aid in  the diagnosis of influenza from Nasopharyngeal swab specimens and  should not be used as a sole basis for treatment. Nasal washings and  aspirates are unacceptable for Xpert  Xpress SARS-CoV-2/FLU/RSV  testing. Fact Sheet for Patients: PinkCheek.be Fact Sheet for Healthcare Providers: GravelBags.it This test is not yet approved or cleared by the [place] FDA and  has been authorized for detection and/or diagnosis of SARS-CoV-2 by  FDA under an Emergency Use Authorization (EUA). This EUA will remain  in effect (meaning this test can be used) for the duration of the  Covid-19 declaration under Section 564(b)(1) of the Act, 21  U.S.C. section 360bbb-3(b)(1), unless the authorization is  terminated or revoked. Performed at [facility], [place] [address], [place], [place] [address]     Procedures and diagnostic studies:  No results found.  Medications:   . aspirin EC  81 mg Oral Daily  . atorvastatin  40 mg Oral Daily  . clopidogrel  75 mg Oral Daily  . heparin  5,000 Units Subcutaneous Q8H  . insulin aspart  0-15 Units Subcutaneous TID WC  . insulin aspart  0-5 Units Subcutaneous QHS  . lisinopril  5 mg Oral Daily  . nicotine  14 mg Transdermal Daily   Continuous Infusions: . sodium chloride 50 mL/hr at 10/08/19 1000     LOS: 1 day   Geradine Girt  [facility]   How to contact the [facility] Attending or Consulting provider [place] or covering provider during after hours [place], for this patient?  1. Check the care team in [facility] and look for a) attending/consulting TRH provider listed and b) the [facility] team listed 2. Log into [email or web address] and use [place]'s universal password to access. If you do not have the password, please contact the hospital operator. 3. Locate the [facility] provider you are looking for under [facility] and page to a number that you can be directly reached. 4. If you still have difficulty reaching the provider, please page the [facility] (Director on Call) for the Hospitalists listed on amion for assistance.  10/08/2019, 11:28 AM

## 2019-10-08 NOTE — Anesthesia Procedure Notes (Signed)
Procedure Name: Intubation Date/Time: 10/08/2019 1:19 PM Performed by: Barrington Ellison, CRNA Pre-anesthesia Checklist: Patient identified, Emergency Drugs available, Suction available and Patient being monitored Patient Re-evaluated:Patient Re-evaluated prior to induction Oxygen Delivery Method: Circle System Utilized Preoxygenation: Pre-oxygenation with 100% oxygen Induction Type: IV induction Ventilation: Mask ventilation without difficulty and Oral airway inserted - appropriate to patient size Laryngoscope Size: Glidescope and 4 Grade View: Grade I Tube type: Oral Tube size: 7.5 mm Number of attempts: 5 or more Airway Equipment and Method: Stylet and Oral airway Placement Confirmation: ETT inserted through vocal cords under direct vision,  positive ETCO2 and breath sounds checked- equal and bilateral Secured at: 23 cm Tube secured with: Tape Dental Injury: Teeth and Oropharynx as per pre-operative assessment  Difficulty Due To: Difficulty was unanticipated and Difficult Airway- due to anterior larynx Future Recommendations: Recommend- induction with short-acting agent, and alternative techniques readily available Comments: Dl x 1 Mac 4 by CRNA, Grade 3 view, entered esophagus, DL x 3 by MDA, grade 2-3 views, entered esophagus each time. GlidescopeGo used and Grade 1 view, strongly recommend videoscope for future intubations due to anterior arway.

## 2019-10-08 NOTE — Transfer of Care (Signed)
Immediate Anesthesia Transfer of Care Note  Patient: David Hendrix  Procedure(s) Performed: CAROTID STENT PLACEMENT IR WITH ANESTHESIA (N/A )  Patient Location: PACU  Anesthesia Type:General  Level of Consciousness: awake and oriented  Airway & Oxygen Therapy: Patient Spontanous Breathing and Patient connected to face mask oxygen  Post-op Assessment: Report given to RN, Patient moving all extremities X 4 and Patient able to stick tongue midline  Post vital signs: Reviewed and stable  Last Vitals:  Vitals Value Taken Time  BP    Temp    Pulse    Resp    SpO2      Last Pain:  Vitals:   10/08/19 0826  TempSrc: Oral  PainSc:          Complications: No apparent anesthesia complications

## 2019-10-08 NOTE — Progress Notes (Signed)
The chaplain followed-up concerning an AD. The chaplain would seek to find a notary and witnesses to complete the AD before a surgery later today.  Lavone Neri Chaplain Resident For questions concerning this note please contact me by pager [phone]

## 2019-10-08 NOTE — Anesthesia Procedure Notes (Signed)
Arterial Line Insertion Start/End5/09/2019 12:20 PM, 10/08/2019 12:30 PM Performed by: Arta Bruce, MD, anesthesiologist  Preanesthetic checklist: patient identified, IV checked, risks and benefits discussed, surgical consent, monitors and equipment checked, pre-op evaluation, timeout performed and anesthesia consent Left, radial was placed Catheter size: 20 G  Attempts: 1 Procedure performed without using ultrasound guided technique. Following insertion, dressing applied and Biopatch. Post procedure assessment: normal  Patient tolerated the procedure well with no immediate complications.

## 2019-10-08 NOTE — Progress Notes (Signed)
The chaplain followed up with the family concerning the completion of an AD. The chaplain was not able to locate a notary prior to surgery. The chaplain does not assess a need to follow-up.  Lavone Neri Chaplain Resident For questions concerning this note please contact me by pager [phone]

## 2019-10-09 DIAGNOSIS — E785 Hyperlipidemia, unspecified: Secondary | ICD-10-CM

## 2019-10-09 DIAGNOSIS — I10 Essential (primary) hypertension: Secondary | ICD-10-CM

## 2019-10-09 DIAGNOSIS — I63239 Cerebral infarction due to unspecified occlusion or stenosis of unspecified carotid arteries: Secondary | ICD-10-CM

## 2019-10-09 DIAGNOSIS — E1165 Type 2 diabetes mellitus with hyperglycemia: Secondary | ICD-10-CM

## 2019-10-09 DIAGNOSIS — I63231 Cerebral infarction due to unspecified occlusion or stenosis of right carotid arteries: Secondary | ICD-10-CM

## 2019-10-09 LAB — GLUCOSE, CAPILLARY
Glucose-Capillary: 170 mg/dL — ABNORMAL HIGH (ref 70–99)
Glucose-Capillary: 148 mg/dL — ABNORMAL HIGH (ref 70–99)
Glucose-Capillary: 185 mg/dL — ABNORMAL HIGH (ref 70–99)
Glucose-Capillary: 130 mg/dL — ABNORMAL HIGH (ref 70–99)
Glucose-Capillary: 139 mg/dL — ABNORMAL HIGH (ref 70–99)

## 2019-10-09 LAB — BASIC METABOLIC PANEL
Anion gap: 14 (ref 5–15)
BUN: 9 mg/dL (ref 8–23)
CO2: 19 mmol/L — ABNORMAL LOW (ref 22–32)
Calcium: 8.6 mg/dL — ABNORMAL LOW (ref 8.9–10.3)
Chloride: 104 mmol/L (ref 98–111)
Creatinine, Ser: 0.55 mg/dL — ABNORMAL LOW (ref 0.61–1.24)
GFR calc Af Amer: >60 mL/min (ref >60)
GFR calc non Af Amer: >60 mL/min (ref >60)
Glucose, Bld: 162 mg/dL — ABNORMAL HIGH (ref 70–99)
Potassium: 3.4 mmol/L — ABNORMAL LOW (ref 3.5–5.1)
Sodium: 137 mmol/L (ref 135–145)

## 2019-10-09 LAB — CBC WITH DIFFERENTIAL/PLATELET
Abs Immature Granulocytes: 0.03 10*3/uL (ref 0.00–0.07)
Basophils Absolute: 0 10*3/uL (ref 0.0–0.1)
Basophils Relative: 0 %
Eosinophils Absolute: 0 10*3/uL (ref 0.0–0.5)
Eosinophils Relative: 0 %
HCT: 41.7 % (ref 39.0–52.0)
Hemoglobin: 14.6 g/dL (ref 13.0–17.0)
Immature Granulocytes: 0 %
Lymphocytes Relative: 16 %
Lymphs Abs: 1.8 10*3/uL (ref 0.7–4.0)
MCH: 32 pg (ref 26.0–34.0)
MCHC: 35 g/dL (ref 30.0–36.0)
MCV: 91.4 fL (ref 80.0–100.0)
Monocytes Absolute: 0.9 10*3/uL (ref 0.1–1.0)
Monocytes Relative: 8 %
Neutro Abs: 8.6 10*3/uL — ABNORMAL HIGH (ref 1.7–7.7)
Neutrophils Relative %: 76 %
Platelets: 325 10*3/uL (ref 150–400)
RBC: 4.56 MIL/uL (ref 4.22–5.81)
RDW: 11.6 % (ref 11.5–15.5)
WBC: 11.3 10*3/uL — ABNORMAL HIGH (ref 4.0–10.5)
nRBC: 0 % (ref 0.0–0.2)

## 2019-10-09 LAB — TRIGLYCERIDES
Triglycerides: 205 mg/dL — ABNORMAL HIGH (ref <150)
Triglycerides: 136 mg/dL (ref <150)

## 2019-10-09 MED ORDER — LISINOPRIL 5 MG PO TABS
5.0000 mg | ORAL_TABLET | Freq: Every day | ORAL | Status: DC
  Administered 2019-10-09 – 2019-10-10 (×2): 5 mg via ORAL
  Filled 2019-10-09 (×2): qty 1

## 2019-10-09 MED ORDER — HYDRALAZINE HCL 25 MG PO TABS: 25.0000 mg | ORAL_TABLET | Freq: Four times a day (QID) | ORAL | Status: DC | PRN

## 2019-10-09 MED ORDER — POTASSIUM CHLORIDE CRYS ER 20 MEQ PO TBCR
40.0000 meq | EXTENDED_RELEASE_TABLET | Freq: Once | ORAL | Status: AC
  Administered 2019-10-09: 40 meq via ORAL
  Filled 2019-10-09: qty 2

## 2019-10-09 MED ORDER — ENOXAPARIN SODIUM 60 MG/0.6ML ~~LOC~~ SOLN
50.0000 mg | Freq: Every day | SUBCUTANEOUS | Status: DC
  Filled 2019-10-09 (×2): qty 0.5

## 2019-10-09 NOTE — Progress Notes (Signed)
STROKE TEAM PROGRESS NOTE   INTERVAL HISTORY His RN is at the bedside.  Pt is lying in bed, awake alert and orientated. He is eager to go home. Currently tapering off cleviprex. Passed swallow and on diet. Neuro stable.   Vitals:   10/09/19 0715 10/09/19 0730 10/09/19 0745 10/09/19 0800  BP: 122/62 (!) 110/98 (!) 122/59 125/62  Pulse: 70 64 66 65  Resp: 16 14 17  (!) 21  Temp:      TempSrc:      SpO2: 100% 96% 98% 97%  Weight:      Height:        CBC:  Recent Labs  Lab 10/05/19 0012 10/05/19 0012 10/07/19 1030 10/09/19 0609  WBC 10.2   < > 9.2 11.3*  NEUTROABS 6.3  --   --  8.6*  HGB 16.8   < > 16.8 14.6  HCT 49.8   < > 49.7 41.7  MCV 92.9   < > 94.3 91.4  PLT 312   < > 323 325   < > = values in this interval not displayed.    Basic Metabolic Panel:  Recent Labs  Lab 10/03/19 0505 10/04/19 0711 10/07/19 1030 10/09/19 0609  NA 139   < > 135 137  K 3.8   < > 4.2 3.4*  CL 106   < > 102 104  CO2 24   < > 21* 19*  GLUCOSE 135*   < > 137* 162*  BUN 8   < > 14 9  CREATININE 0.72   < > 0.66 0.55*  CALCIUM 8.9   < > 9.6 8.6*  MG 2.2  --   --   --    < > = values in this interval not displayed.   Lipid Panel:     Component Value Date/Time   CHOL 199 10/02/2019 0428   TRIG 226 (H) 10/02/2019 0428   HDL 22 (L) 10/02/2019 0428   CHOLHDL 9.0 10/02/2019 0428   VLDL 45 (H) 10/02/2019 0428   LDLCALC 132 (H) 10/02/2019 0428   HgbA1c:  Lab Results  Component Value Date   HGBA1C 7.6 (H) 10/02/2019   Urine Drug Screen: No results found for: LABOPIA, COCAINSCRNUR, LABBENZ, AMPHETMU, THCU, LABBARB  Alcohol Level No results found for: ETH  IMAGING past 24 hours No results found.  PHYSICAL EXAM  Temp:  [98.2 F (36.8 C)-99.8 F (37.7 C)] 98.4 F (36.9 C) (05/05 1600) Pulse Rate:  [57-90] 73 (05/05 1815) Resp:  [10-21] 20 (05/05 1815) BP: (81-170)/(45-132) 139/67 (05/05 1815) SpO2:  [93 %-100 %] 97 % (05/05 1815) Arterial Line BP: (117-208)/(35-71) 164/50 (05/05  1045)  General - Well nourished, well developed, in no apparent distress.  Ophthalmologic - fundi not visualized due to noncooperation.  Cardiovascular - Regular rhythm and rate.  Mental Status -  Level of arousal and orientation to time, place, and person were intact. Language including expression, naming, repetition, comprehension was assessed and found intact.  Cranial Nerves II - XII - II - Visual field intact OU. III, IV, VI - Extraocular movements intact. V - Facial sensation intact bilaterally. VII - Facial movement intact bilaterally. VIII - Hearing & vestibular intact bilaterally. X - Palate elevates symmetrically. XI - Chin turning & shoulder shrug intact bilaterally. XII - Tongue protrusion intact.  Motor Strength - The patient's strength was normal in all extremities and pronator drift was absent.  Bulk was normal and fasciculations were absent.   Motor Tone - Muscle tone was  assessed at the neck and appendages and was normal.  Reflexes - The patient's reflexes were symmetrical in all extremities and he had no pathological reflexes.  Sensory - Light touch, temperature/pinprick were assessed and were symmetrical.    Coordination - The patient had normal movements in the hands with no ataxia or dysmetria.  Tremor was absent.  Gait and Station - deferred.   ASSESSMENT/PLAN David Hendrix is a 64 y.o. male with history of HTN and DB but noncompliant w/ meds d/t cost presenting 4/27 with L facial droop and dysphagia since 4/24. Found to have L ICA string sign and scheduled for L ICA stent. Presented to ED with increased confusion and requested IP stenting.   Recent stroke with carotid stenosis - R MCA infarcts in setting of symptomatic R ICA stenosis s/p angioplasty of proximal ICA but w/ continued cavernous ICA occlusion    CT head 4/27 large R frontal lobe hypodensity  MRI 4/27 R insula and R frontal operculum infarct. Punctate medial R hypothalamic infarct. Old  R occipital cortical and subcortical infarct.   CTA head & neck 4/27 R ICA occlusion at bulb. Missing R M2-M3 branch (R frontal opercular infarct).  Carotid Doppler  R ICA occlusion  Cerebral angio 4/29 near complete R ICA occlusion w/ proximal string sign.   Cerebral angio 5/4 angioplasty proximal R ICA but continued cavernous occlusion  2D Echo EF 60-65%. No source of embolus   LDL 132  HgbA1c 7.6  P2y12 = 71  Heparin 5000 units sq tid for VTE prophylaxis  aspirin 81 mg daily and clopidogrel 75 mg daily prior to admission, now on aspirin 81 mg daily and clopidogrel 75 mg daily. Continue DAPT for 3 months and then plavix alone  Therapy recommendations:  SLP   Disposition:  pending   Hypertension  Home meds:  zestril 5  Stable . BP goal < 180  . Gradually normalize BP in 2-3 days . Long-term BP goal 130-150 given right ICA occlusion  Hyperlipidemia  Home meds:  lipitor 40, resumed in hospital  LDL 132, goal < 70  Continue statin at discharge  Diabetes type II Uncontrolled  HgbA1c 7.6, goal < 7.0  CBGs  SSI  Close PCP follow up  Tobacco abuse  Current smoker  Smoking cessation counseling provided  Nicotine patch provided  Pt is willing to quit  Other Stroke Risk Factors  Advanced age  Obesity, Body mass index is 30.76 kg/m., recommend weight loss, diet and exercise as appropriate   Other Active Problems  Hx medicine noncompliance but taking meds since last hospitalization  Urinary incontinence   Hospital day # 2  Neurology will sign off. Please call with questions. Pt will follow up with stroke clinic NP at [facility] in about 4 weeks. Thanks for the consult.  Marvel Plan, MD PhD Stroke Neurology 10/09/2019 6:43 PM   To contact Stroke Continuity provider, please refer to [email or web address]. After hours, contact General Neurology

## 2019-10-09 NOTE — Progress Notes (Signed)
0800 MD Deveshwar paged regarding pt SBP >140 by art line; told to go by cuff at this time.   0930 Ally PA on unit, discussed plan of care, increased SBP goal to 120-160. Orders to start home lisinopril, discontinue art line.

## 2019-10-09 NOTE — Progress Notes (Signed)
Referring Physician(s): Simonne Maffucci  Supervising Physician: Luanne Bras  Patient Status:  [facility] - In-pt  Chief Complaint: None  Subjective:  History of proximal right ICA stenosis s/p cerebral arteriogram with balloon angioplasty with continued occlusion of right ICA cavernous segment (unchanged) 10/08/2019 by Dr. Estanislado Pandy. Patient awake and alert laying in bed. Follows simple commands. Moving all extremities. Right groin incision c/d/i.   Allergies: Chlorhexidine  Medications: Prior to Admission medications   Medication Sig Start Date End Date Taking? Authorizing Provider  ACCU-CHEK GUIDE test strip 1 each by Other route in the morning, at noon, in the evening, and at bedtime. 10/04/19  Yes [provider]  Accu-Chek Softclix Lancets lancets 1 each by Other route in the morning, at noon, in the evening, and at bedtime. 10/04/19  Yes [provider]  aspirin EC 81 MG tablet Take 1 tablet (81 mg total) by mouth daily. 10/04/19 10/03/20 Yes Florencia Reasons, MD  atorvastatin (LIPITOR) 40 MG tablet Take 1 tablet (40 mg total) by mouth daily. 10/05/19  Yes Florencia Reasons, MD  blood glucose meter kit and supplies KIT Dispense based on patient and insurance preference. Use up to four times daily as directed. (FOR ICD-9 250.00, 250.01). Patient taking differently: 1 each by Other route See admin instructions. Dispense based on patient and insurance preference. Use up to four times daily as directed. (FOR ICD-9 250.00, 250.01). 10/04/19  Yes Florencia Reasons, MD  clopidogrel (PLAVIX) 75 MG tablet Take 1 tablet (75 mg total) by mouth daily. 10/04/19 10/03/20 Yes Florencia Reasons, MD  lisinopril (ZESTRIL) 5 MG tablet Take 1 tablet (5 mg total) by mouth daily. 10/04/19 10/03/20 Yes Florencia Reasons, MD  metFORMIN (GLUCOPHAGE) 500 MG tablet Take 1 tablet (500 mg total) by mouth 2 (two) times daily with a meal. 10/04/19 10/03/20 Yes Florencia Reasons, MD     Vital Signs: BP 125/62 (BP Location: Right Arm)    Pulse 65   Temp 98.6 F (37 C) (Oral)   Resp (!) 21   Ht 5' 10.98" (1.803 m)   Wt 220 lb 7.4 oz (100 kg)   SpO2 97%   BMI 30.76 kg/m   Physical Exam Vitals and nursing note reviewed.  Constitutional:      General: He is not in acute distress.    Appearance: Normal appearance.  Pulmonary:     Effort: Pulmonary effort is normal. No respiratory distress.  Skin:    General: Skin is warm and dry.     Comments: Right groin incision soft with mild tenderness to palpation, no active bleeding or hematoma.  Neurological:     Mental Status: He is alert.     Comments: Alert, awake, and oriented to person, place ([facility] in [place]), and time (month). Speech and comprehension intact. PERRL bilaterally. EOMs intact bilaterally without nystagmus or subjective diplopia. Left facial droop present, unchanged. Tongue midline. Can spontaneously move all extremities, hand grip strength equal bilaterally. No pronator drift. Distal pulses (DPs) 1+ bilaterally.     Imaging: No results found.  Labs:  CBC: Recent Labs    10/04/19 0711 10/05/19 0012 10/07/19 1030 10/09/19 0609  WBC 9.5 10.2 9.2 11.3*  HGB 16.1 16.8 16.8 14.6  HCT 46.0 49.8 49.7 41.7  PLT 281 312 323 325    COAGS: Recent Labs    10/01/19 1227 10/05/19 0012  INR 1.0 1.0  APTT 29  --     BMP: Recent Labs    10/04/19 0711 10/05/19 0012  10/07/19 1030 10/09/19 0609  NA 137 136 135 137  K 3.8 4.0 4.2 3.4*  CL 107 101 102 104  CO2 22 25 21* 19*  GLUCOSE 136* 146* 137* 162*  BUN _0 CALCIUM 8.9 9.5 9.6 8.6*  CREATININE 0.67 0.77 0.66 0.55*  GFRNONAA >60 >60 >60 >60  GFRAA >60 >60 >60 >60    LIVER FUNCTION TESTS: Recent Labs    10/01/19 1227 10/05/19 0012  BILITOT 1.1 0.8  AST 14* 15  ALT 13 17  ALKPHOS 66 71  PROT 7.5 8.0  ALBUMIN 4.3 4.0    Assessment and Plan:  History of proximal right ICA stenosis s/p cerebral arteriogram with balloon angioplasty with continued occlusion of  right ICA cavernous segment (unchanged) 10/08/2019 by Dr. Estanislado Pandy. Patient's condition stable- can spontaneously move all extremities, left facial droop present (unchanged). Right groin incision stable, distal pulses (DPs) 1+ bilaterally. Continue taking Plavix 75 mg once daily and Aspirin 81 mg once daily. Per Dr. Estanislado Pandy- D/C arterial line and cleviprex, start home BP meds, new BP goal = [phone] systolic. Plan to follow-up with Dr. Estanislado Pandy with CTA head/neck (with contrast), followed by consultation in 4 weeks- order placed to facilitate this. From NIR standpoint, ok to discharge once weaned off Cleviprex. Further plans per [facility]/neurology- appreciate and agree with management. Please call NIR with questions/concerns.   Electronically Signed: Earley Abide, PA-C 10/09/2019, 10:03 AM   I spent a total of 25 Minutes at the the patient's bedside AND on the patient's [facility] floor or unit, greater than 50% of which was counseling/coordinating care for proximal right ICA stenosis s/p revascularization.

## 2019-10-09 NOTE — Progress Notes (Signed)
Chaplain met with Mr. Main.  Children took Advanced Directive paperwork with them and [facility] not[facility] was not available today.    Chaplain will try to follow-up tomorrow.

## 2019-10-09 NOTE — Progress Notes (Signed)
PROGRESS NOTE    David [facility]rix  PPI:[id number] DOB: 1956/05/12 DOA: 10/07/2019 PCP: Patient, No Pcp Per    Chief Complaint  Patient presents with  . Carotid Stent Placement    Brief Narrative:  64 year old gentleman prior history of hypertension, diabetes mellitus, noncompliant to medications, large right frontal CVA on 4/27 was initially scheduled to get an right ICA stent but patient declined to stay and was discharged.  Patient returns this admission for stent placement.  On arrival neurology and IR  consulted for further recommendations.  On 10/08/2019 patient underwent  s/p cerebral arteriogram with balloon angioplasty with continued occlusion of right ICA cavernous segment (unchanged) 10/08/2019 by Dr. Corliss Skains.  Today PT/OT evaluation ordered.   Assessment & Plan:   Principal Problem:   Carotid stenosis, symptomatic, with infarction [facility]) Active Problems:   CVA (cerebral vascular accident) (HCC)   Essential hypertension   Controlled type 2 diabetes mellitus with hyperglycemia, without long-term current use of insulin (HCC)   Noncompliance   Dyslipidemia   Obesity (BMI 30-39.9)   Tobacco dependence   Internal carotid artery stenosis, right   Symptomatic carotid stenosis with large right frontal lobe CVA Angiogram performed on 10/03/19 confirmed near complete carotid occlusion. He underwent cerebral arteriogram with balloon angioplasty with continued occlusion of right ICA cavernous segment on 10/08/2019 by Dr. Corliss Skains. Resume aspirin 81 mg, Plavix 75 mg along with Lipitor 40 mg daily.     Type 2 diabetes mellitus with hyperglycemia CBG (last 3)  Recent Labs    10/09/19 0305 10/09/19 0838 10/09/19 1212  GLUCAP 170* 148* 185*   Continue with sliding scale insulin.    Hypokalemia Replaced.    Hyperlipidemia Continue with Lipitor.    Body mass index is 30.76 kg/m.    Recently diagnosed large right frontal CVA. Further recommendations as per  neurology.    Essential hypertension Continue with the lisinopril and hydralazine as needed.  DVT prophylaxis: Lovenox.  Code Status: full code.  Family Communication: none at bedside.  Disposition:   Status is: Inpatient  Remains inpatient appropriate because:Hemodynamically unstable   Dispo: The patient is from: Home              Anticipated d/c is to: pending.               Anticipated d/c date is: 2 days              Patient currently is not medically stable to d/c.        Consultants:   IR  Neurology.    Procedures: s/p cerebral arteriogram with balloon angioplasty with continued occlusion of right ICA cavernous segment (unchanged) 10/08/2019 by Dr. Corliss Skains  Antimicrobials: none.   Subjective: No new complaints.   Objective: Vitals:   10/09/19 1415 10/09/19 1430 10/09/19 1445 10/09/19 1500  BP: 130/84 (!) 126/112 132/66 (!) 127/92  Pulse: 78 80 78 76  Resp: 17 18 11 16   Temp:      TempSrc:      SpO2: 99% 98% 98% 100%  Weight:      Height:        Intake/Output Summary (Last 24 hours) at 10/09/2019 1518 Last data filed at 10/09/2019 1500 Gross per 24 hour  Intake 3842.32 ml  Output 3420 ml  Net 422.32 ml   Filed Weights   10/07/19 0623 10/08/19 1151  Weight: 100 kg 100 kg    Examination:  General exam: Appears calm and comfortable  Respiratory system: Clear to auscultation. Respiratory effort  normal. Cardiovascular system: S1 & S2 heard, RRR. No JVD, No pedal edema. Gastrointestinal system: Abdomen is nondistended, soft and nontender. . Normal bowel sounds heard. Central nervous system: Alert and oriented. No focal neurological deficits. Extremities: Symmetric 5 x 5 power. Skin: No rashes, lesions or ulcers Psychiatry:  Mood & affect appropriate.     Data Reviewed: I have personally reviewed following labs and imaging studies  CBC: Recent Labs  Lab 10/03/19 0505 10/04/19 0711 10/05/19 0012 10/07/19 1030 10/09/19 0609  WBC 9.9 9.5  10.2 9.2 11.3*  NEUTROABS 6.1 6.1 6.3  --  8.6*  HGB 15.7 16.1 16.8 16.8 14.6  HCT 46.3 46.0 49.8 49.7 41.7  MCV 92.2 91.3 92.9 94.3 91.4  PLT 253 281 312 323 325    Basic Metabolic Panel: Recent Labs  Lab 10/03/19 0505 10/04/19 0711 10/05/19 0012 10/07/19 1030 10/09/19 0609  NA 139 137 136 135 137  K 3.8 3.8 4.0 4.2 3.4*  CL 106 107 101 102 104  CO2 24 22 25  21* 19*  GLUCOSE 135* 136* 146* 137* 162*  BUN 8 11 14 14 9   CREATININE 0.72 0.67 0.77 0.66 0.55*  CALCIUM 8.9 8.9 9.5 9.6 8.6*  MG 2.2  --   --   --   --     GFR: Estimated Creatinine Clearance: 110.9 mL/min (A) (by C-G formula based on SCr of 0.55 mg/dL (L)).  Liver Function Tests: Recent Labs  Lab 10/05/19 0012  AST 15  ALT 17  ALKPHOS 71  BILITOT 0.8  PROT 8.0  ALBUMIN 4.0    CBG: Recent Labs  Lab 10/08/19 1719 10/08/19 2007 10/08/19 2216 10/09/19 0305 10/09/19 1212  GLUCAP 168* 173* 202* 170* 185*     Recent Results (from the past 240 hour(s))  Respiratory Panel by RT PCR (Flu A&B, Covid) - Nasopharyngeal Swab     Status: None   Collection Time: 10/01/19  4:30 PM   Specimen: Nasopharyngeal Swab  Result Value Ref Range Status   SARS Coronavirus 2 by RT PCR NEGATIVE NEGATIVE Final    Comment: (NOTE) SARS-CoV-2 target nucleic acids are NOT DETECTED. The SARS-CoV-2 RNA is generally detectable in upper respiratoy specimens during the acute phase of infection. The lowest concentration of SARS-CoV-2 viral copies this assay can detect is 131 copies/mL. A negative result does not preclude SARS-Cov-2 infection and should not be used as the sole basis for treatment or other patient management decisions. A negative result may occur with  improper specimen collection/handling, submission of specimen other than nasopharyngeal swab, presence of viral mutation(s) within the areas targeted by this assay, and inadequate number of viral copies (<131 copies/mL). A negative result must be combined with  clinical observations, patient history, and epidemiological information. The expected result is Negative. Fact Sheet for Patients:  12/09/19 Fact Sheet for Healthcare Providers:  10/03/19 This test is not yet ap proved or cleared by the https://www.moore.com/ FDA and  has been authorized for detection and/or diagnosis of SARS-CoV-2 by FDA under an Emergency Use Authorization (EUA). This EUA will remain  in effect (meaning this test can be used) for the duration of the COVID-19 declaration under Section 564(b)(1) of the Act, 21 U.S.C. section 360bbb-3(b)(1), unless the authorization is terminated or revoked sooner.    Influenza A by PCR NEGATIVE NEGATIVE Final   Influenza B by PCR NEGATIVE NEGATIVE Final    Comment: (NOTE) The Xpert Xpress SARS-CoV-2/FLU/RSV assay is intended as an aid in  the diagnosis of influenza from  Nasopharyngeal swab specimens and  should not be used as a sole basis for treatment. Nasal washings and  aspirates are unacceptable for Xpert Xpress SARS-CoV-2/FLU/RSV  testing. Fact Sheet for Patients: https://www.moore.com/ Fact Sheet for Healthcare Providers: https://www.young.biz/ This test is not yet approved or cleared by the [place] FDA and  has been authorized for detection and/or diagnosis of SARS-CoV-2 by  FDA under an Emergency Use Authorization (EUA). This EUA will remain  in effect (meaning this test can be used) for the duration of the  Covid-19 declaration under Section 564(b)(1) of the Act, 21  U.S.C. section 360bbb-3(b)(1), unless the authorization is  terminated or revoked. Performed at [facility], [address] [place], [place] [address]   Respiratory Panel by RT PCR (Flu A&B, Covid) - Nasopharyngeal Swab     Status: None   Collection Time: 10/07/19 10:57 AM   Specimen: Nasopharyngeal Swab  Result Value Ref Range Status   SARS  Coronavirus 2 by RT PCR NEGATIVE NEGATIVE Final    Comment: (NOTE) SARS-CoV-2 target nucleic acids are NOT DETECTED. The SARS-CoV-2 RNA is generally detectable in upper respiratoy specimens during the acute phase of infection. The lowest concentration of SARS-CoV-2 viral copies this assay can detect is 131 copies/mL. A negative result does not preclude SARS-Cov-2 infection and should not be used as the sole basis for treatment or other patient management decisions. A negative result may occur with  improper specimen collection/handling, submission of specimen other than nasopharyngeal swab, presence of viral mutation(s) within the areas targeted by this assay, and inadequate number of viral copies (<131 copies/mL). A negative result must be combined with clinical observations, patient history, and epidemiological information. The expected result is Negative. Fact Sheet for Patients:  https://www.moore.com/ Fact Sheet for Healthcare Providers:  https://www.young.biz/ This test is not yet ap proved or cleared by the [place] FDA and  has been authorized for detection and/or diagnosis of SARS-CoV-2 by FDA under an Emergency Use Authorization (EUA). This EUA will remain  in effect (meaning this test can be used) for the duration of the COVID-19 declaration under Section 564(b)(1) of the Act, 21 U.S.C. section 360bbb-3(b)(1), unless the authorization is terminated or revoked sooner.    Influenza A by PCR NEGATIVE NEGATIVE Final   Influenza B by PCR NEGATIVE NEGATIVE Final    Comment: (NOTE) The Xpert Xpress SARS-CoV-2/FLU/RSV assay is intended as an aid in  the diagnosis of influenza from Nasopharyngeal swab specimens and  should not be used as a sole basis for treatment. Nasal washings and  aspirates are unacceptable for Xpert Xpress SARS-CoV-2/FLU/RSV  testing. Fact Sheet for Patients: https://www.moore.com/ Fact Sheet  for Healthcare Providers: https://www.young.biz/ This test is not yet approved or cleared by the [place] FDA and  has been authorized for detection and/or diagnosis of SARS-CoV-2 by  FDA under an Emergency Use Authorization (EUA). This EUA will remain  in effect (meaning this test can be used) for the duration of the  Covid-19 declaration under Section 564(b)(1) of the Act, 21  U.S.C. section 360bbb-3(b)(1), unless the authorization is  terminated or revoked. Performed at [facility], [address] [place], [place] [address]   MRSA PCR Screening     Status: None   Collection Time: 10/08/19  4:55 PM   Specimen: Nasal Mucosa; Nasopharyngeal  Result Value Ref Range Status   MRSA by PCR NEGATIVE NEGATIVE Final    Comment:        The GeneXpert MRSA Assay (  FDA approved for NASAL specimens only), is one component of a comprehensive MRSA colonization surveillance program. It is not intended to diagnose MRSA infection nor to guide or monitor treatment for MRSA infections. Performed at [facility], [place] [address], [place], [place] [address]          Radiology Studies: No results found.      Scheduled Meds: . aspirin EC  81 mg Oral Daily  . atorvastatin  40 mg Oral Daily  . clopidogrel  75 mg Oral Daily  . enoxaparin (LOVENOX) injection  50 mg Subcutaneous QHS  . insulin aspart  0-15 Units Subcutaneous TID WC  . insulin aspart  0-5 Units Subcutaneous QHS  . lisinopril  5 mg Oral Daily  . nicotine  14 mg Transdermal Daily   Continuous Infusions: . clevidipine Stopped (10/09/19 1231)     LOS: 2 days        Hosie Poisson, MD [facility]   To contact the attending provider between 7A-7P or the covering provider during after hours 7P-7A, please log into the web site [email or web address] and access using universal [place] password for that web site. If you do not have the password, please call the hospital operator.   10/09/2019, 3:18 PM

## 2019-10-09 NOTE — Evaluation (Signed)
Clinical/Bedside Swallow Evaluation Patient Details  Name: David Hendrix MRN: [id number] Date of Birth: 09-04-1955  Today's Date: 10/09/2019 Time: SLP Start Time (ACUTE ONLY): 0815 SLP Stop Time (ACUTE ONLY): 0832 SLP Time Calculation (min) (ACUTE ONLY): 17 min  Past Medical History:  Past Medical History:  Diagnosis Date  . Diabetes mellitus type 2 in obese ([place])   . Dyslipidemia   . Hypertension   . Obesity (BMI 30-39.9) 10/07/2019  . Stroke ([place]) 10/01/2019   large R frontal lobe CVA  . Tobacco dependence    none since 10/01/19   Past Surgical History:  Past Surgical History:  Procedure Laterality Date  . BACK SURGERY     x2, has hardware  . ELBOW SURGERY    . IR ANGIO INTRA EXTRACRAN SEL COM CAROTID INNOMINATE BILAT MOD SED  10/03/2019  . IR ANGIO VERTEBRAL SEL SUBCLAVIAN INNOMINATE UNI L MOD SED  10/03/2019  . IR ANGIO VERTEBRAL SEL VERTEBRAL UNI R MOD SED  10/03/2019  . IR US GUIDE VASC ACCESS RIGHT  10/03/2019  . RADIOLOGY WITH ANESTHESIA N/A 10/08/2019   Procedure: CAROTID STENT PLACEMENT IR WITH ANESTHESIA;  Surgeon: Luanne Bras, MD;  Location: [place];  Service: Radiology;  Laterality: N/A;   HPI:  Pt is a 64 yo male with recent large R frontal CVA on 4/27 who declined staying for R ICA stent placement. During that admission he was placed on Dys 2 diet with thin liquids. He returned 5/4 for elective stent placement. PMH also includes: HTN, DM, HLD, tobacco use   Assessment / Plan / Recommendation Clinical Impression  Pt presents with a functional appearing oropharyngeal swallow despite mild left-sided weakness. He does not have anterior spillage or significant residue with solids as was reported during recent admission, perhaps suggestive of mild improvements since that time. Of note, pt did have a moderately-sized protrusion that could be felt anteriorly to his thyroid cartilage that was round and firm in nature. He was not sure how long this was present; RN also  notified. Would recommend additional assessment by MD. SLP will also f/u briefly for tolerance in light of mild CN deficits and noted cognitive difficulties (see full evaluation for details).  SLP Visit Diagnosis: Dysphagia, oral phase (R13.11)    Aspiration Risk  Mild aspiration risk    Diet Recommendation Regular;Thin liquid   Liquid Administration via: Cup;Straw Medication Administration: Whole meds with liquid Supervision: Patient able to self feed;Intermittent supervision to cue for compensatory strategies Compensations: Slow rate;Small sips/bites;Minimize environmental distractions Postural Changes: Seated upright at 90 degrees    Other  Recommendations Oral Care Recommendations: Oral care BID   Follow up Recommendations 24 hour supervision/assistance      Frequency and Duration min 1 x/week  1 week       Prognosis Prognosis for Safe Diet Advancement: Good Barriers to Reach Goals: Cognitive deficits      Swallow Study   General HPI: Pt is a 64 yo male with recent large R frontal CVA on 4/27 who declined staying for R ICA stent placement. During that admission he was placed on Dys 2 diet with thin liquids. He returned 5/4 for elective stent placement. PMH also includes: HTN, DM, HLD, tobacco use Type of Study: Bedside Swallow Evaluation Previous Swallow Assessment: see HPI Diet Prior to this Study: NPO Temperature Spikes Noted: No Respiratory Status: Room air History of Recent Intubation: (for procedure only) Behavior/Cognition: Alert;Cooperative;Pleasant mood Oral Cavity Assessment: Within Functional Limits Oral Care Completed by SLP: Yes Oral Cavity -  Dentition: Dentures, top(partials) Vision: Functional for self-feeding Self-Feeding Abilities: Able to feed self Patient Positioning: Upright in bed Baseline Vocal Quality: Normal Volitional Cough: Strong Volitional Swallow: Able to elicit    Oral/Motor/Sensory Function Overall Oral Motor/Sensory Function: Mild  impairment Facial ROM: Reduced left;Suspected CN VII (facial) dysfunction Facial Symmetry: Abnormal symmetry left;Suspected CN VII (facial) dysfunction Facial Strength: Reduced left;Suspected CN VII (facial) dysfunction Facial Sensation: Within Functional Limits Lingual ROM: Within Functional Limits Lingual Symmetry: Within Functional Limits Lingual Strength: Within Functional Limits Velum: Within Functional Limits Mandible: Within Functional Limits   Ice Chips Ice chips: Not tested   Thin Liquid Thin Liquid: Within functional limits Presentation: Cup;Straw    Nectar Thick Nectar Thick Liquid: Not tested   Honey Thick Honey Thick Liquid: Not tested   Puree Puree: Within functional limits Presentation: Self Fed;Spoon   Solid     Solid: Within functional limits Presentation: Self Fed       David [facility]rix., M.A. CCC-SLP Acute Rehabilitation Services Pager [phone] Office [phone]  10/09/2019,10:10 AM

## 2019-10-09 NOTE — Evaluation (Signed)
Physical Therapy Evaluation Patient Details Name: David [facility] MRN: [id number] DOB: 11-Mar-1956 Today's Date: 10/09/2019   History of Present Illness  64 y.o. male with medical history significant of HTN; DM; medication non-compliance; and large right frontal CVA on 4/27.  He was recommended to have IR place R ICA stent but declined to stay.  He returned today for "elective stent placement." PT's daughter reports increased irritability and executive function impairment. Pt underwent Rt ICA stent placement however continued occlusion of cavernous segment remained unchanged.  Clinical Impression  Pt presents to PT with deficits in gait, balance, cognition, vision/attention, and awareness of deficits. PT is impulsive during session and requires PT cues to improve safety. PT often joking with therapist possibly to cover for cognitive deficits at times. PT is able to perform all mobility required in the home setting without physical assistance at this time, however pt will benefit from 24/7 supervision due to cognitive and safety awareness deficits. Pt will benefit from further assessment of higher level cognitive tasks, vision, and spatial awareness.    Follow Up Recommendations Outpatient PT;Supervision/Assistance - 24 hour    Equipment Recommendations  None recommended by PT    Recommendations for Other Services       Precautions / Restrictions Precautions Precautions: Fall Restrictions Weight Bearing Restrictions: No      Mobility  Bed Mobility Overal bed mobility: Needs Assistance Bed Mobility: Supine to Sit;Sit to Supine     Supine to sit: Supervision Sit to supine: Supervision      Transfers Overall transfer level: Needs assistance Equipment used: None Transfers: Sit to/from Stand Sit to Stand: Supervision            Ambulation/Gait Ambulation/Gait assistance: Supervision Gait Distance (Feet): 150 Feet Assistive device: None Gait Pattern/deviations: Step-through  pattern Gait velocity: functional Gait velocity interpretation: 1.31 - 2.62 ft/sec, indicative of limited community ambulator General Gait Details: pt with steady step through gait pattern, one instance of very minor LOB when pt notices cords in front of him when turning at edge of bed but pt is able to self correct.   Stairs            Wheelchair Mobility    Modified Rankin (Stroke Patients Only) Modified Rankin (Stroke Patients Only) Pre-Morbid Rankin Score: No symptoms Modified Rankin: Moderately severe disability     Balance Overall balance assessment: Needs assistance Sitting-balance support: No upper extremity supported;Feet supported Sitting balance-Leahy Scale: Good Sitting balance - Comments: supervision   Standing balance support: No upper extremity supported;During functional activity Standing balance-Leahy Scale: Good Standing balance comment: supervision to toilet in standing                             Pertinent Vitals/Pain Pain Assessment: No/denies pain    Home Living Family/patient expects to be discharged to:: Private residence Living Arrangements: Alone Available Help at Discharge: Family(son) Type of Home: House Home Access: Level entry     Home Layout: One level Home Equipment: None Additional Comments: per previous chart review pt reports estranged relations with son, not sure how much assistance is available    Prior Function Level of Independence: Independent   Gait / Transfers Assistance Needed: pt reports that he lives on the same property as the business and Alycia Rossetti ( son ) lives 5 minutes from that property  ADL's / Homemaking Assistance Needed: outdoor shower setup by standing holding water hose overhead  Comments: Independent with all adls and  IADLs.  Normally works everyday on car transmissions.     Hand Dominance   Dominant Hand: Right    Extremity/Trunk Assessment   Upper Extremity Assessment Upper Extremity  Assessment: Overall WFL for tasks assessed    Lower Extremity Assessment Lower Extremity Assessment: Overall WFL for tasks assessed    Cervical / Trunk Assessment Cervical / Trunk Assessment: Normal  Communication   Communication: No difficulties  Cognition Arousal/Alertness: Awake/alert Behavior During Therapy: Flat affect;Impulsive Overall Cognitive Status: Impaired/Different from baseline Area of Impairment: Attention;Following commands;Safety/judgement;Awareness;Problem solving                   Current Attention Level: Selective Memory: Decreased recall of precautions;Decreased short-term memory Following Commands: Follows one step commands consistently;Follows multi-step commands with increased time Safety/Judgement: Decreased awareness of safety;Decreased awareness of deficits Awareness: Intellectual Problem Solving: Slow processing General Comments: pt with slowed processing, pt is impulsive and demonstrates reduced awareness of surroundings      General Comments General comments (skin integrity, edema, etc.): VSS during session, pt does demonstrate some visual deficits vs inattention during session, unable to obtain mask when PT requests pt to when mask is one foot away on IV pole. Pt often joking during session and seems to have lack of insight into his current deficits.    Exercises     Assessment/Plan    PT Assessment Patient needs continued PT services  PT Problem List Decreased balance;Decreased mobility;Decreased cognition;Decreased safety awareness;Decreased knowledge of precautions       PT Treatment Interventions DME instruction;Gait training;Stair training;Functional mobility training;Therapeutic activities;Therapeutic exercise;Balance training;Neuromuscular re-education;Cognitive remediation;Patient/family education    PT Goals (Current goals can be found in the Care Plan section)  Acute Rehab PT Goals Patient Stated Goal: to go home PT Goal  Formulation: With patient Time For Goal Achievement: 10/23/19 Potential to Achieve Goals: Good Additional Goals Additional Goal #1: Pt will score >41/56 on BERG balance test, indicating a low falls risk.    Frequency Min 4X/week   Barriers to discharge Decreased caregiver support      Co-evaluation               AM-PAC PT "6 Clicks" Mobility  Outcome Measure Help needed turning from your back to your side while in a flat bed without using bedrails?: None Help needed moving from lying on your back to sitting on the side of a flat bed without using bedrails?: None Help needed moving to and from a bed to a chair (including a wheelchair)?: None Help needed standing up from a chair using your arms (e.g., wheelchair or bedside chair)?: None Help needed to walk in hospital room?: None Help needed climbing 3-5 steps with a railing? : A Little 6 Click Score: 23    End of Session   Activity Tolerance: Patient tolerated treatment well Patient left: in bed;with call bell/phone within reach;with bed alarm set Nurse Communication: Mobility status PT Visit Diagnosis: Other abnormalities of gait and mobility (R26.89);Other symptoms and signs involving the nervous system (R29.898)    Time: 1025-8527 PT Time Calculation (min) (ACUTE ONLY): 16 min   Charges:   PT Evaluation $PT Eval Moderate Complexity: 1 Mod          Zenaida Niece, PT, DPT Acute Rehabilitation Pager: [phone]   Zenaida Niece 10/09/2019, 3:38 PM

## 2019-10-09 NOTE — Evaluation (Signed)
Speech Language Pathology Evaluation Patient Details Name: [facility] MRN: [id number] DOB: [birth year] Today's Date: 10/09/2019 Time: 8315-1761 SLP Time Calculation (min) (ACUTE ONLY): 16 min  Problem List:  Patient Active Problem List   Diagnosis Date Noted  . Internal carotid artery stenosis, right 10/08/2019  . Carotid stenosis, symptomatic, with infarction ([facility]) 10/07/2019  . Dyslipidemia 10/07/2019  . Obesity (BMI 30-39.9) 10/07/2019  . Tobacco dependence 10/07/2019  . Essential hypertension   . Controlled type 2 diabetes mellitus with hyperglycemia, without long-term current use of insulin ([facility])   . Noncompliance   . CVA (cerebral vascular accident) ([facility]) 10/01/2019   Past Medical History:  Past Medical History:  Diagnosis Date  . Diabetes mellitus type 2 in obese ([facility])   . Dyslipidemia   . Hypertension   . Obesity (BMI 30-39.9) 10/07/2019  . Stroke ([facility]) 10/01/2019   large R frontal lobe CVA  . Tobacco dependence    none since 10/01/19   Past Surgical History:  Past Surgical History:  Procedure Laterality Date  . BACK SURGERY     x2, has hardware  . ELBOW SURGERY    . IR ANGIO INTRA EXTRACRAN SEL COM CAROTID INNOMINATE BILAT MOD SED  10/03/2019  . IR ANGIO VERTEBRAL SEL SUBCLAVIAN INNOMINATE UNI L MOD SED  10/03/2019  . IR ANGIO VERTEBRAL SEL VERTEBRAL UNI R MOD SED  10/03/2019  . IR US GUIDE VASC ACCESS RIGHT  10/03/2019  . RADIOLOGY WITH ANESTHESIA N/A 10/08/2019   Procedure: CAROTID STENT PLACEMENT IR WITH ANESTHESIA;  Surgeon: Luanne Bras, MD;  Location: [facility];  Service: Radiology;  Laterality: N/A;   HPI:  Pt is a 64 yo male with recent large R frontal CVA on 4/27 who declined staying for R ICA stent placement. During that admission he was placed on Dys 2 diet with thin liquids. He returned 5/4 for elective stent placement. PMH also includes: HTN, DM, HLD, tobacco use   Assessment / Plan / Recommendation Clinical Impression  Pt presents with cognitive  deficits in the setting of recent R MCA CVA, including reduced attention to the left. He has reduced awareness and ability to self-monitor and correct. Although he did well with a delayed recall test, he is also not remembering new information and daily events, asking questions in repetition at times. There is concern for his safety awareness particularly in light of his recent decision to leave prior to receiving medical care. Pt would benefit from SLP f/u to maximize cognition and safety.     SLP Assessment  SLP Recommendation/Assessment: Patient needs continued Speech Lanaguage Pathology Services SLP Visit Diagnosis: Cognitive communication deficit (R41.841)    Follow Up Recommendations  24 hour supervision/assistance;Other (comment)(SLP f/u at next level of care)    Frequency and Duration min 2x/week  2 weeks      SLP Evaluation Cognition  Overall Cognitive Status: Impaired/Different from baseline Arousal/Alertness: Awake/alert Orientation Level: Oriented X4 Attention: Sustained Sustained Attention: Impaired Sustained Attention Impairment: Verbal complex Memory: Impaired Memory Impairment: Decreased recall of new information Awareness: Impaired Awareness Impairment: Intellectual impairment;Anticipatory impairment Problem Solving: Impaired Problem Solving Impairment: Verbal complex Executive Function: Self Monitoring;Self Correcting Self Monitoring: Impaired Self Monitoring Impairment: Verbal complex Self Correcting: Impaired Self Correcting Impairment: Verbal complex Safety/Judgment: Impaired       Comprehension  Auditory Comprehension Overall Auditory Comprehension: Appears within functional limits for tasks assessed    Expression Expression Primary Mode of Expression: Verbal Verbal Expression Overall Verbal Expression: Appears within functional limits for tasks assessed  Oral / Motor  Oral Motor/Sensory Function Overall Oral Motor/Sensory Function: Mild  impairment Facial ROM: Reduced left;Suspected CN VII (facial) dysfunction Facial Symmetry: Abnormal symmetry left;Suspected CN VII (facial) dysfunction Facial Strength: Reduced left;Suspected CN VII (facial) dysfunction Facial Sensation: Within Functional Limits Lingual ROM: Within Functional Limits Lingual Symmetry: Within Functional Limits Lingual Strength: Within Functional Limits Velum: Within Functional Limits Mandible: Within Functional Limits Motor Speech Overall Motor Speech: Appears within functional limits for tasks assessed   GO                     [facility]., M.A. CCC-SLP Acute Rehabilitation Services Pager [phone] Office [phone]  10/09/2019, 10:23 AM

## 2019-10-10 DIAGNOSIS — I6521 Occlusion and stenosis of right carotid artery: Principal | ICD-10-CM

## 2019-10-10 LAB — BASIC METABOLIC PANEL
Anion gap: 11 (ref 5–15)
BUN: 14 mg/dL (ref 8–23)
CO2: 16 mmol/L — ABNORMAL LOW (ref 22–32)
Calcium: 8.6 mg/dL — ABNORMAL LOW (ref 8.9–10.3)
Chloride: 107 mmol/L (ref 98–111)
Creatinine, Ser: 0.75 mg/dL (ref 0.61–1.24)
GFR calc Af Amer: >60 mL/min (ref >60)
GFR calc non Af Amer: >60 mL/min (ref >60)
Glucose, Bld: 172 mg/dL — ABNORMAL HIGH (ref 70–99)
Potassium: 4.1 mmol/L (ref 3.5–5.1)
Sodium: 134 mmol/L — ABNORMAL LOW (ref 135–145)

## 2019-10-10 LAB — CBC
HCT: 46.9 % (ref 39.0–52.0)
Hemoglobin: 15.7 g/dL (ref 13.0–17.0)
MCH: 31.3 pg (ref 26.0–34.0)
MCHC: 33.5 g/dL (ref 30.0–36.0)
MCV: 93.4 fL (ref 80.0–100.0)
Platelets: 309 10*3/uL (ref 150–400)
RBC: 5.02 MIL/uL (ref 4.22–5.81)
RDW: 11.9 % (ref 11.5–15.5)
WBC: 9.3 10*3/uL (ref 4.0–10.5)
nRBC: 0 % (ref 0.0–0.2)

## 2019-10-10 LAB — GLUCOSE, CAPILLARY
Glucose-Capillary: 154 mg/dL — ABNORMAL HIGH (ref 70–99)
Glucose-Capillary: 156 mg/dL — ABNORMAL HIGH (ref 70–99)

## 2019-10-10 MED ORDER — NICOTINE 14 MG/24HR TD PT24: 14.0000 mg | MEDICATED_PATCH | Freq: Every day | TRANSDERMAL | 0 refills | Status: AC

## 2019-10-10 MED ORDER — CLOPIDOGREL BISULFATE 75 MG PO TABS
75.0000 mg | ORAL_TABLET | Freq: Every day | ORAL | 2 refills | Status: AC
Start: 2019-10-10 — End: 2020-01-08

## 2019-10-10 MED ORDER — LISINOPRIL 10 MG PO TABS: 10.0000 mg | ORAL_TABLET | Freq: Every day | ORAL | 1 refills | Status: AC

## 2019-10-10 MED ORDER — SENNOSIDES-DOCUSATE SODIUM 8.6-50 MG PO TABS: 1.0000 | ORAL_TABLET | Freq: Every evening | ORAL | Status: AC | PRN

## 2019-10-10 MED ORDER — LISINOPRIL 10 MG PO TABS: 10.0000 mg | ORAL_TABLET | Freq: Every day | ORAL | Status: DC

## 2019-10-10 MED ORDER — ASPIRIN EC 81 MG PO TBEC
81.0000 mg | DELAYED_RELEASE_TABLET | Freq: Every day | ORAL | 2 refills | Status: AC
Start: 2019-10-10 — End: 2020-01-08

## 2019-10-10 NOTE — TOC Transition Note (Signed)
Transition of Care [facility]) - CM/SW Discharge Note   Patient Details  Name: David [facility]rix MRN: [id number] Date of Birth: 05/20/1956  Transition of Care [facility]) CM/SW Contact:  Glennon Mac, RN Phone Number: 10/10/2019, 5:01 PM   Clinical Narrative:  [name] is a 64 y.o. male with history of untreated HTN and DM2,  presenting with left side weakness, slurred speech, drooling and unable to eat/drink x 3 days. He did not receive IV t-PA due to out side of time window. MRI shows 6.6 x 6.1 x 4.0 cm region of acute infarction affecting the right insula and right frontal operculum consistent with right MCA branch vessel infarction.  PTA, pt independent, lives alone.  PT/OT recommending OP follow up, but family prefers HH follow up.  Pt active with [facility] services for HHPT/OT and ST prior to admission; he is agreeable to continued services at discharge.  Pt states he will have assistance from son and daughter at home.  Notified [facility] of need for resumption of HH services.      Final next level of care: Home w Home Health Services Barriers to Discharge: Barriers Resolved   Patient Goals and CMS Choice Patient states their goals for this hospitalization and ongoing recovery are:: Im going home CMS Medicare.gov Compare Post Acute Care list provided to:: Patient Choice offered to / list presented to : Patient  Discharge Placement                       Discharge Plan and Services   Discharge Planning Services: CM Consult Post Acute Care Choice: Home Health                    HH Arranged: PT, OT, Speech Therapy HH Agency: [facility] Date [facility] Agency Contacted: 10/10/19 Time HH Agency Contacted: 1213 Representative spoke with at [facility] Agency: Lorenza Chick  Social Determinants of Health (SDOH) Interventions     Readmission Risk Interventions No flowsheet data found.  Quintella Baton, RN, BSN  Trauma/Neuro ICU Case Manager [phone]

## 2019-10-10 NOTE — Progress Notes (Signed)
Physical Therapy Treatment Patient Details Name: David [facility] MRN: [id number] DOB: 1956-01-09 Today's Date: 10/10/2019    History of Present Illness 64 y.o. male with medical history significant of HTN; DM; medication non-compliance; and large right frontal CVA on 4/27.  He was recommended to have IR place R ICA stent but declined to stay.  He returned today for "elective stent placement." PT's daughter reports increased irritability and executive function impairment. Pt underwent Rt ICA stent placement however continued occlusion of cavernous segment remained unchanged.    PT Comments    Pt tolerated treatment well, really hoping he can discharge soon. Pt performs multiple dynamic gait tasks as described below, requiring supervision for lunges and with some changes in gait speed or gait patter with head turns and other gait styles. Pt does demonstrate increased instability with lunging tasks and requires PT cues to slow gait pattern, but otherwise demonstrates no significant balance deviations during gait tasks. Pt will continue to benefit from acute PT POC to progress gait activities and dynamic balance while also incorporating multi-tasking cognitive tasks. Pt continues to recommend outpatient PT with family supervision at this time.   Follow Up Recommendations  Outpatient PT;Supervision/Assistance - 24 hour     Equipment Recommendations  None recommended by PT    Recommendations for Other Services       Precautions / Restrictions Precautions Precautions: Fall Restrictions Weight Bearing Restrictions: No    Mobility  Bed Mobility Overal bed mobility: Independent Bed Mobility: Supine to Sit;Sit to Supine     Supine to sit: Independent Sit to supine: Independent      Transfers Overall transfer level: Independent Equipment used: None Transfers: Sit to/from Stand Sit to Stand: Independent            Ambulation/Gait Ambulation/Gait assistance: Supervision Gait  Distance (Feet): 300 Feet Assistive device: None Gait Pattern/deviations: Step-through pattern Gait velocity: functional Gait velocity interpretation: >2.62 ft/sec, indicative of community ambulatory General Gait Details: pt is able to perform head turns with slowed gait speed and limited cervical ROM, change gait speed and stop abruptly on command. Pt is able to walk backward and side step without LOB. Pt with increased lateral sway requiring supervision during lunge exercise with increased step length   Stairs             Wheelchair Mobility    Modified Rankin (Stroke Patients Only) Modified Rankin (Stroke Patients Only) Pre-Morbid Rankin Score: No symptoms Modified Rankin: Moderate disability     Balance Overall balance assessment: Needs assistance Sitting-balance support: No upper extremity supported;Feet supported Sitting balance-Leahy Scale: Normal     Standing balance support: No upper extremity supported;During functional activity Standing balance-Leahy Scale: Good Standing balance comment: supervision                 Standardized Balance Assessment Standardized Balance Assessment : Berg Balance Test Berg Balance Test Sit to Stand: Able to stand  independently using hands Standing Unsupported: Able to stand safely 2 minutes Sitting with Back Unsupported but Feet Supported on Floor or Stool: Able to sit safely and securely 2 minutes Stand to Sit: Sits safely with minimal use of hands Transfers: Able to transfer safely, minor use of hands Standing Unsupported with Eyes Closed: Able to stand 10 seconds safely Standing Ubsupported with Feet Together: Able to place feet together independently and stand for 1 minute with supervision From Standing, Reach Forward with Outstretched Arm: Can reach confidently >25 cm (10") From Standing Position, Pick up Object from Floor: Able  to pick up shoe, needs supervision From Standing Position, Turn to Look Behind Over each  Shoulder: Looks behind from both sides and weight shifts well Turn 360 Degrees: Able to turn 360 degrees safely in 4 seconds or less Standing Unsupported, Alternately Place Feet on Step/Stool: Able to stand independently and safely and complete 8 steps in 20 seconds Standing Unsupported, One Foot in Front: Able to plae foot ahead of the other independently and hold 30 seconds Standing on One Leg: Able to lift leg independently and hold equal to or more than 3 seconds Total Score: 50        Cognition Arousal/Alertness: Awake/alert Behavior During Therapy: Impulsive Overall Cognitive Status: Within Functional Limits for tasks assessed                                        Exercises      General Comments General comments (skin integrity, edema, etc.): pt hypertensive at end of session, 171/75, BP reading may be inaccurate as pt moving UE during BP reading      Pertinent Vitals/Pain Pain Assessment: No/denies pain    Home Living                      Prior Function            PT Goals (current goals can now be found in the care plan section) Acute Rehab PT Goals Patient Stated Goal: to go home Progress towards PT goals: Progressing toward goals    Frequency    Min 4X/week      PT Plan Current plan remains appropriate    Co-evaluation              AM-PAC PT "6 Clicks" Mobility   Outcome Measure  Help needed turning from your back to your side while in a flat bed without using bedrails?: None Help needed moving from lying on your back to sitting on the side of a flat bed without using bedrails?: None Help needed moving to and from a bed to a chair (including a wheelchair)?: None Help needed standing up from a chair using your arms (e.g., wheelchair or bedside chair)?: None Help needed to walk in hospital room?: None Help needed climbing 3-5 steps with a railing? : None 6 Click Score: 24    End of Session   Activity Tolerance:  Patient tolerated treatment well Patient left: in bed;with call bell/phone within reach;with bed alarm set Nurse Communication: Mobility status PT Visit Diagnosis: Other abnormalities of gait and mobility (R26.89);Other symptoms and signs involving the nervous system (K44.010)     Time: 2725-3664 PT Time Calculation (min) (ACUTE ONLY): 18 min  Charges:  $Gait Training: 8-22 mins                     Arlyss Gandy, PT, DPT Acute Rehabilitation Pager: [phone]    Arlyss Gandy 10/10/2019, 1:00 PM

## 2019-10-10 NOTE — Progress Notes (Signed)
  Speech Language Pathology Treatment: Dysphagia;Cognitive-Linquistic  Patient Details Name: David [facility] MRN: [id number] DOB: [birth year] Today's Date: 10/10/2019 Time: 4268-3419 SLP Time Calculation (min) (ACUTE ONLY): 15 min  Assessment / Plan / Recommendation Clinical Impression  Pt consumed regular solids and thin liquids, managing mild L sided weakness with Mod I. No overt s/s of aspiration were observed. Will sign off for swallowing at this time. Pt also selected meal options for next three meals with Min cues for problem solving and anticipatory awareness to monitor the amount of carbs he was selecting. After ordering he needed Min-Mod cues for immediate recall, as he both mixed up what he had ordered for each meal and forgot items that he had ordered. He would still benefit from SLP f/u for cognition.   HPI HPI: Pt is a 64 yo male with recent large R frontal CVA on 4/27 who declined staying for R ICA stent placement. During that admission he was placed on Dys 2 diet with thin liquids. He returned 5/4 for elective stent placement. PMH also includes: HTN, DM, HLD, tobacco use      SLP Plan  Goals updated       Recommendations  Diet recommendations: Regular;Thin liquid Liquids provided via: Straw Medication Administration: Whole meds with liquid Supervision: Patient able to self feed;Intermittent supervision to cue for compensatory strategies Compensations: Slow rate;Small sips/bites;Minimize environmental distractions Postural Changes and/or Swallow Maneuvers: Seated upright 90 degrees                Oral Care Recommendations: Oral care BID Follow up Recommendations: Outpatient SLP;24 hour supervision/assistance SLP Visit Diagnosis: Cognitive communication deficit (R41.841);Dysphagia, oral phase (R13.11) Plan: Goals updated       GO                 Mahala Menghini., M.A. CCC-SLP Acute Rehabilitation Services Pager [phone] Office [phone]  10/10/2019, 10:37  AM

## 2019-10-11 NOTE — Discharge Summary (Signed)
Physician Discharge Summary  David Hendrix BDZ:[id number] DOB: [birth year] DOA: 10/07/2019  PCP: Patient, No Pcp Per  Admit date: 10/07/2019 Discharge date: 10/10/2019  Admitted From: Home.  Disposition:  Home with Home health.   Recommendations for Outpatient Follow-up:  1. Follow up with PCP in 1-2 weeks 2. Please obtain BMP/CBC in one week 3. Please follow up neurology as recommended  4. Please follow up with IR as recommended.     Discharge Condition:stable.  CODE STATUS: full code.  Diet recommendation: Heart Healthy   Brief/Interim Summary: 64 year old gentleman prior history of hypertension, diabetes mellitus, noncompliant to medications, large right frontal CVA on 4/27 was initially scheduled to get an right ICA stent but patient declined to stay and was discharged.  Patient returns this admission for stent placement.  On arrival neurology and IR  consulted for further recommendations.  On 10/08/2019 patient underwent  s/p cerebral arteriogram with balloon angioplasty with continued occlusion of right ICA cavernous segment (unchanged) 10/08/2019 by Dr. Estanislado Pandy.    Discharge Diagnoses:  Principal Problem:   Carotid stenosis, symptomatic, with infarction [facility]) Active Problems:   CVA (cerebral vascular accident) ([place])   Essential hypertension   Controlled type 2 diabetes mellitus with hyperglycemia, without long-term current use of insulin ([place])   Noncompliance   Dyslipidemia   Obesity (BMI 30-39.9)   Tobacco dependence   Internal carotid artery stenosis, right  Symptomatic carotid stenosis with large right frontal lobe CVA Angiogram performed on 10/03/19 confirmed near complete carotid occlusion. He underwent cerebral arteriogram with balloon angioplasty with continued occlusion of right ICA cavernous segment on 10/08/2019 by Dr. Estanislado Pandy. Resume aspirin 81 mg, Plavix 75 mg for 3 months followed by plavix alone.  along with Lipitor 40 mg daily.     Type 2 diabetes  mellitus with hyperglycemia Resume home meds.     Hypokalemia Replaced.    Hyperlipidemia Continue with Lipitor.    Body mass index is 30.76 kg/m.    Recently diagnosed large right frontal CVA. Further recommendations as per neurology.    Essential hypertension Continue with the lisinopril and hydralazine as needed.    Discharge Instructions  Discharge Instructions    Ambulatory referral to Neurology   Complete by: As directed    Follow up with stroke clinic NP (Jessica Vanschaick or Cecille Rubin, if both not available, consider Zachery Dauer, or Ahern) at [facility] in about 4 weeks. Thanks.   Diet - low sodium heart healthy   Complete by: As directed    Increase activity slowly   Complete by: As directed      Allergies as of 10/10/2019      Reactions   Chlorhexidine       Medication List    TAKE these medications   Accu-Chek Guide test strip Generic drug: glucose blood 1 each by Other route in the morning, at noon, in the evening, and at bedtime.   Accu-Chek Softclix Lancets lancets 1 each by Other route in the morning, at noon, in the evening, and at bedtime.   aspirin EC 81 MG tablet Take 1 tablet (81 mg total) by mouth daily.   atorvastatin 40 MG tablet Commonly known as: LIPITOR Take 1 tablet (40 mg total) by mouth daily.   blood glucose meter kit and supplies Kit Dispense based on patient and insurance preference. Use up to four times daily as directed. (FOR ICD-9 250.00, 250.01). What changed:   how much to take  how to take this  when to take this  clopidogrel 75 MG tablet Commonly known as: Plavix Take 1 tablet (75 mg total) by mouth daily.   lisinopril 10 MG tablet Commonly known as: ZESTRIL Take 1 tablet (10 mg total) by mouth daily. What changed:   medication strength  how much to take   metFORMIN 500 MG tablet Commonly known as: Glucophage Take 1 tablet (500 mg total) by mouth 2 (two) times daily with a  meal.   nicotine 14 mg/24hr patch Commonly known as: NICODERM CQ - dosed in mg/24 hours Place 1 patch (14 mg total) onto the skin daily.   senna-docusate 8.6-50 MG tablet Commonly known as: Senokot-S Take 1 tablet by mouth at bedtime as needed for mild constipation.      Follow-up Information    Luanne Bras, MD Follow up in 4 week(s).   Specialties: Interventional Radiology, Radiology Why: Please follow-up with Dr. Estanislado Pandy in clinic 4 weeks after discharge. Our office will call you to set up this appointment. Contact information: [address] [place] [address] [phone]        [facility]. Schedule an appointment as soon as possible for a visit in 4 week(s).   Specialty: Neurology Contact information: [address] [place]. Schedule an appointment as soon as possible for a visit in 1 week(s).          Allergies  Allergen Reactions  . Chlorhexidine     Consultations: IR NEUROLOGY.    Procedures/Studies: CT ANGIO HEAD W OR WO CONTRAST  Addendum Date: 10/01/2019   ADDENDUM REPORT: 10/01/2019 18:55 ADDENDUM: Aortic Atherosclerosis (ICD10-I70.0) and Emphysema (ICD10-J43.9). 5 mm pulmonary nodule right upper lobe. Non-contrast chest CT can be considered in 12 months if patient is high-risk, which appears to be the case based on the emphysema. This recommendation follows the consensus statement: Guidelines for Management of Incidental Pulmonary Nodules Detected on CT Images: From the Fleischner Society 2017; Radiology 2017; 284:228-243. Electronically Signed   By: Nelson Chimes M.D.   On: 10/01/2019 18:55   Result Date: 10/01/2019 CLINICAL DATA:  Weakness and facial droop. Difficulty swallowing. Acute infarction right frontal lobe. Abnormal flow right internal carotid artery. EXAM: CT ANGIOGRAPHY HEAD AND NECK TECHNIQUE: Multidetector CT imaging of the head  and neck was performed using the standard protocol during bolus administration of intravenous contrast. Multiplanar CT image reconstructions and MIPs were obtained to evaluate the vascular anatomy. Carotid stenosis measurements (when applicable) are obtained utilizing NASCET criteria, using the distal internal carotid diameter as the denominator. CONTRAST:  25m OMNIPAQUE IOHEXOL 350 MG/ML SOLN COMPARISON:  CT and MRI same day FINDINGS: Aortic atherosclerosis. Branching pattern is normal without origin stenosis. CTA NECK FINDINGS Aortic arch: Aortic atherosclerosis. Branching pattern is normal without origin stenosis. Right carotid system: Common carotid artery widely patent to the bifurcation. Occlusion of the ICA in the bulb. No reconstituted flow seen in the cervical region. Left carotid system: Common carotid artery widely patent to the bifurcation. Calcified and soft plaque at the carotid bifurcation and ICA bulb but no stenosis compared to the more distal cervical ICA diameter. Vertebral arteries: Both vertebral artery origins are widely patent. The vertebral arteries are patent through the cervical region to the foramen magnum without stenosis greater than 30%. Skeleton: Ordinary cervical spondylosis and facet arthropathy. Other neck: No mass or lymphadenopathy. Upper chest: Emphysema and pulmonary scarring. 5 mm pulmonary nodule in the right upper lobe. Review of the MIP  images confirms the above findings CTA HEAD FINDINGS Anterior circulation: Right internal carotid artery does not show any flow through the skull base. Reconstituted in flow at the carotid terminus primarily on the basis of a patent anterior communicating and posterior communicating arteries. Left internal carotid artery is patent through the siphon region without stenosis greater than 30%. No large or medium vessel occlusion seen on the left. On the right, there is a missing M2 to M3 branch serving the frontal operculum. Posterior  circulation: Both vertebral arteries widely patent through the foramen magnum. Right vertebral terminates in PICA. Left vertebral artery supplies the basilar. No basilar stenosis. Posterior circulation branch vessels are patent. Distal branch vessels do show atherosclerotic irregularity. Venous sinuses: Patent and normal. Anatomic variants: None significant. Review of the MIP images confirms the above findings IMPRESSION: Right internal carotid artery occlusion in the ICA bulb. Flow to the anterior circulation on the right occurs by patent anterior and posterior communicating arteries. I do not see significant external to internal collateral flow. Apparent missing M2 to M3 branch serving the right frontal operculum infarction region. Electronically Signed: By: Nelson Chimes M.D. On: 10/01/2019 18:46   DG Chest 2 View  Result Date: 10/01/2019 CLINICAL DATA:  Hypertension CVA EXAM: CHEST - 2 VIEW COMPARISON:  None. FINDINGS: No focal airspace disease or pleural effusion. Borderline cardiomegaly. Hazy/streaky atelectasis or scarring at the bases. No pneumothorax. IMPRESSION: Hazy/streaky atelectasis or scarring at the bases. Electronically Signed   By: Donavan Foil M.D.   On: 10/01/2019 17:01   CT HEAD WO CONTRAST  Result Date: 10/01/2019 CLINICAL DATA:  Weakness, possible stroke. EXAM: CT HEAD WITHOUT CONTRAST TECHNIQUE: Contiguous axial images were obtained from the base of the skull through the vertex without intravenous contrast. COMPARISON:  None. FINDINGS: Brain: Large right frontal low density is noted most consistent with acute infarction. No midline shift is noted. Ventricular size is within normal limits. No definite hemorrhage is noted. Vascular: No hyperdense vessel or unexpected calcification. Skull: Normal. Negative for fracture or focal lesion. Sinuses/Orbits: No acute finding. Other: None. IMPRESSION: Large right frontal low density is noted most consistent with acute infarction. No definite  hemorrhage is noted. MRI is recommended for further evaluation. Electronically Signed   By: Marijo Conception M.D.   On: 10/01/2019 13:47   CT ANGIO NECK W OR WO CONTRAST  Addendum Date: 10/01/2019   ADDENDUM REPORT: 10/01/2019 18:55 ADDENDUM: Aortic Atherosclerosis (ICD10-I70.0) and Emphysema (ICD10-J43.9). 5 mm pulmonary nodule right upper lobe. Non-contrast chest CT can be considered in 12 months if patient is high-risk, which appears to be the case based on the emphysema. This recommendation follows the consensus statement: Guidelines for Management of Incidental Pulmonary Nodules Detected on CT Images: From the Fleischner Society 2017; Radiology 2017; 284:228-243. Electronically Signed   By: Nelson Chimes M.D.   On: 10/01/2019 18:55   Result Date: 10/01/2019 CLINICAL DATA:  Weakness and facial droop. Difficulty swallowing. Acute infarction right frontal lobe. Abnormal flow right internal carotid artery. EXAM: CT ANGIOGRAPHY HEAD AND NECK TECHNIQUE: Multidetector CT imaging of the head and neck was performed using the standard protocol during bolus administration of intravenous contrast. Multiplanar CT image reconstructions and MIPs were obtained to evaluate the vascular anatomy. Carotid stenosis measurements (when applicable) are obtained utilizing NASCET criteria, using the distal internal carotid diameter as the denominator. CONTRAST:  30m OMNIPAQUE IOHEXOL 350 MG/ML SOLN COMPARISON:  CT and MRI same day FINDINGS: Aortic atherosclerosis. Branching pattern is normal without origin stenosis.  CTA NECK FINDINGS Aortic arch: Aortic atherosclerosis. Branching pattern is normal without origin stenosis. Right carotid system: Common carotid artery widely patent to the bifurcation. Occlusion of the ICA in the bulb. No reconstituted flow seen in the cervical region. Left carotid system: Common carotid artery widely patent to the bifurcation. Calcified and soft plaque at the carotid bifurcation and ICA bulb but no  stenosis compared to the more distal cervical ICA diameter. Vertebral arteries: Both vertebral artery origins are widely patent. The vertebral arteries are patent through the cervical region to the foramen magnum without stenosis greater than 30%. Skeleton: Ordinary cervical spondylosis and facet arthropathy. Other neck: No mass or lymphadenopathy. Upper chest: Emphysema and pulmonary scarring. 5 mm pulmonary nodule in the right upper lobe. Review of the MIP images confirms the above findings CTA HEAD FINDINGS Anterior circulation: Right internal carotid artery does not show any flow through the skull base. Reconstituted in flow at the carotid terminus primarily on the basis of a patent anterior communicating and posterior communicating arteries. Left internal carotid artery is patent through the siphon region without stenosis greater than 30%. No large or medium vessel occlusion seen on the left. On the right, there is a missing M2 to M3 branch serving the frontal operculum. Posterior circulation: Both vertebral arteries widely patent through the foramen magnum. Right vertebral terminates in PICA. Left vertebral artery supplies the basilar. No basilar stenosis. Posterior circulation branch vessels are patent. Distal branch vessels do show atherosclerotic irregularity. Venous sinuses: Patent and normal. Anatomic variants: None significant. Review of the MIP images confirms the above findings IMPRESSION: Right internal carotid artery occlusion in the ICA bulb. Flow to the anterior circulation on the right occurs by patent anterior and posterior communicating arteries. I do not see significant external to internal collateral flow. Apparent missing M2 to M3 branch serving the right frontal operculum infarction region. Electronically Signed: By: Nelson Chimes M.D. On: 10/01/2019 18:46   MR BRAIN WO CONTRAST  Result Date: 10/01/2019 CLINICAL DATA:  Hypertension and diabetes. Difficulty swallowing. Weakness. Abnormal  head CT right frontal region. EXAM: MRI HEAD WITHOUT CONTRAST TECHNIQUE: Multiplanar, multiecho pulse sequences of the brain and surrounding structures were obtained without intravenous contrast. COMPARISON:  Head CT same day FINDINGS: Brain: There is acute infarction in the right insula and frontal operculum with the region measuring approximately 6.6 x 6.1 x 4.0 cm. Swelling of the region of infarction but without hemorrhage. Punctate acute infarction in the medial right hypothalamus. No focal abnormality affects the brainstem or cerebellum. Cerebral hemispheres otherwise show moderate chronic small-vessel changes of the deep and subcortical white matter. There is an old cortical and subcortical infarction in the right occipital lobe. No midline shift. No hydrocephalus. No extra-axial collection. Vascular: Abnormal flow pattern in the right internal carotid artery. There could be right internal carotid artery occlusion or very slow flow. Skull and upper cervical spine: Negative Sinuses/Orbits: Mild seasonal mucosal inflammatory changes of the sinuses. Orbits negative. Other: None IMPRESSION: 6.6 x 6.1 x 4.0 cm region of acute infarction affecting the right insula and right frontal operculum consistent with right MCA branch vessel infarction. Slow flow or no flow in the right ICA at the skull base. The infarction shows mild swelling but there is no mass effect or shift. No sign of blood products within the infarction. Punctate acute infarction also demonstrated in the medial right hypothalamus. Background pattern of moderate chronic small-vessel ischemic changes affecting the cerebral hemispheric white matter. Old right occipital cortical and subcortical  infarction. Electronically Signed   By: Nelson Chimes M.D.   On: 10/01/2019 17:42   IR CT Head Ltd  Result Date: 10/10/2019 CLINICAL DATA:  Right cerebral hemispheric ischemic infarct. 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 EXAM:  IR PERCUTANEOUS ART THORMBECTOMY/INFUSION INTRACRANIAL INCLUDE DIAG ANGIO; IR CT HEAD LIMITED; RADIOLOGY EXAMINATION; IR ENDOVASCULAR INTRACRANIAL INFUSION OTHER THAN THROMBOLYSIS ARTERIAL INCLUDE DIAG ANGIO COMPARISON:  Diagnostic catheter arteriogram of October 03, 2019, and CT angiogram of the head and neck of October 01, 2019. MEDICATIONS: Heparin 1000 units IV; Ancef 2 g IV antibiotic was administered within 1 hour of the procedure. ANESTHESIA/SEDATION: General anesthesia. CONTRAST:  Isovue 300 approximately 90 mL. FLUOROSCOPY TIME:  Fluoroscopy Time: 78 minutes 36 seconds (3390 mGy). COMPLICATIONS: None immediate. TECHNIQUE: Informed written consent was obtained from the patient after a thorough discussion of the procedural risks, benefits and alternatives. All questions were addressed. Maximal Sterile Barrier Technique was utilized including caps, mask, sterile gowns, sterile gloves, sterile drape, hand hygiene and skin antiseptic. A timeout was performed prior to the initiation of the procedure. The right groin was prepped and draped in the usual sterile fashion. Thereafter using modified Seldinger technique, transfemoral access into the right common femoral artery was obtained without difficulty. Over a 0.035 inch guidewire, an 8 French 25 cm Pinnacle sheath was inserted. Through this, and also over 0.035 inch guidewire, a 5 [place] JB 1 catheter was advanced to the aortic arch region and selectively positioned in the right common carotid artery, the left common carotid artery and the left vertebral artery. FINDINGS: The right common carotid arteriogram demonstrates the right external carotid artery and its major branches to be widely patent. The right internal carotid artery at the bulb demonstrates a complete occlusion, with a delayed string sign opacification of the proximal 1/3 of the right internal carotid artery. More distally there is no evidence of reconstitution of the right ICA from the ipsilateral  external carotid artery branches. ENDOVASCULAR REVASCULARIZATION OF THE OCCLUDED RIGHT INTERNAL CAROTID ARTERY EXTRA CRANIALLY WITH BALLOON ANGIOPLASTY, AND MECHANICAL THROMBECTOMY WITH ASPIRATION The diagnostic JB 1 catheter in right common carotid artery was exchanged over a 0.035 inch 300 cm Rosen exchange guidewire for a 95 cm 087 balloon guide catheter which was positioned just proximal to the right internal carotid artery origin. The guidewire was removed. Good aspiration was obtained from the balloon guide catheter. Over a 0.014 inch standard Synchro micro guidewire, a Trevo ProVue 021 microcatheter was then advanced just distal to the balloon guide in the right internal carotid artery bulb region. With a gentle manipulation, the micro guidewire could be advanced to the mid cervical right internal carotid artery. Further advancement of the micro guidewire was met with resistance. The micro guidewire was removed. A gentle control arteriogram performed through the microcatheter demonstrated improved opacification in the proximal right internal carotid artery at the level of the severe stenosis at the bulb. The microcatheter was then exchanged for a 014 inch 300 cm Transend softip exchange micro guidewire using biplane roadmap technique and constant fluoroscopic guidance. A 4 mm x 30 mm Viatrac 014 angioplasty balloon microcatheter which been prepped with 50% contrast and 50% heparinized saline infusion was then advanced and positioned adequate distant at the site of the severe stenosis. A control inflation was then performed of the balloon using micro inflation syringe device via micro tubing to normal pressure where it was maintained for approximately 30 seconds. Balloon was then deflated and retrieved and removed. A control arteriogram  performed through the balloon guide demonstrated now more string like flow into the distal right internal carotid artery. The microcatheter was reintroduced over the exchange  micro guidewire which was removed. An 016 Fathom micro guidewire was then gently manipulated with the microcatheter to the cervical petrous junction. The guidewire was removed. Steering aspiration was obtained at the hub of the microcatheter. A gentle control arteriogram performed through the microcatheter demonstrated contrast mixed with copious amounts of the filling defects consistent with clot in the distal cervical left ICA extending into the petrous segment. A 6 mm x 40 mm Solitaire X retrieval device was then advanced to the distal end of the microcatheter. This was then deployed in the usual manner with the microcatheter retrieved more proximally. With proximal flow arrest in the right internal carotid artery, and constant aspiration with a penumbra aspiration device at the hub of the balloon guide catheter, the combination of the retrieval device, and the microcatheter were retrieved and removed. Aspiration was continued through the hub of the balloon guide. Small specks of dark of clots could be seen being aspirated into the canister. The balloon guide was then deflated and retrieved just proximal to the origin of the right internal carotid artery. An arteriogram performed at this site now demonstrated improved opacification into the right internal carotid artery extra cranially and to the level of the proximal petrous segment. An 027 Marksman microcatheter was then advanced over a 0.014 inch standard Synchro micro guidewire to the petrous cavernous junction. Aspiration was applied through the balloon guide at this site with a few specks of clot noted in the aspirate. Through the [facility] 027 microcatheter which was then advanced at the petrous cavernous junction, initially an 014 inch and then two 016 inch micro guidewires were used to advanced into the more distal cavernous and supraclinoid segments without success. A control arteriogram performed gently through the microcatheter demonstrated extensive  firm circumferential filling defect extending from the proximal cavernous segment. No antegrade contrast was seen into the more distal cavernous segment. A control arteriogram performed through the microcatheter following multiple attempts to cross the firm severe occlusion of the caval cavernous segment, demonstrated contrast around the clot, but also contrast was noted into the adjacent tubular structures likely representing venous channels. 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 The balloon guide catheter was retrieved into the right common carotid artery and arteriogram performed at this site continued to demonstrate flow in the right external carotid artery branch, and also modestly improved flow in the right internal carotid artery proximally to the cranial skull base. The balloon guide was then retrieved and removed. A 5 French diagnostic catheter was reintroduced over a 0.035 inch Roadrunner guidewire, and select cannulation was performed through the left vertebral artery, and the left common carotid artery. The left vertebral artery arteriogram continued to demonstrate patency of the left vertebral artery proximally and also extending to the cranial skull base. Wide patency was maintained of the left vertebrobasilar junction, the basilar artery, the posterior cerebral arteries, the superior cerebellar arteries and the anterior-inferior cerebellar arteries into the capillary and venous phases. Prompt simultaneous retrograde opacification via the right posterior communicating artery was seen of the supraclinoid right ICA and the right middle and the right anterior cerebral artery into the capillary and venous phases. No change was seen in the occluded inferior division of the right middle cerebral artery seen on a previous arteriogram corresponding to the patient's ischemic  infarction in the right frontal cortical and  subcortical regions. The left common carotid arteriogram continued to demonstrate wide patency of the left external carotid artery and the left internal carotid artery to the cranial skull base. The petrous, the cavernous and the supraclinoid segments remain patent. The left middle cerebral artery and the left anterior cerebral artery opacify into the capillary and venous phases. Minimal cross-filling via the anterior and posterior branches arising from the left pericallosal artery was noted. The diagnostic catheter was removed. The 8 French sheath in the right groin was then exchanged over a 0.035 inch Roadrunner guidewire for an 8 French Angio-Seal closure device. The right groin appeared soft. Distal pulses and the posterior tibial regions, and the dorsal petrous regions remained Dopplerable. CT scan of the brain demonstrated no evidence of intracranial hemorrhage, or mass effect or midline shift. Initially prior to the procedure, a CT performed on the table again demonstrates no evidence of hemorrhage at the site of the previous ischemic stroke in the right frontal region. The patient was then extubated without difficulty. Upon recovery, the patient was able to obey simple commands and was moving all fours with minimal left pronation drift. He denied, complaints of headaches, nausea, vomiting or visual changes. He was then transferred to the neuro ICU for post revascularization care. IMPRESSION: Status post endovascular revascularization of occluded right internal carotid artery proximally extra cranially with balloon angioplasty, 1 pass with a Solitaire X 6 mm x 40 mm retrieval device and Penumbra aspiration. 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 Reconstitution of the right middle cerebral artery and the right anterior cerebral artery via the anterior communicating artery, and the posterior communicating artery as described above. PLAN: Follow-up in the clinic 4  weeks post discharge. Electronically Signed   By: Luanne Bras M.D.   On: 10/09/2019 12:48   IR CT Head Ltd  Result Date: 10/10/2019 CLINICAL DATA:  Right cerebral hemispheric ischemic infarct. 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 EXAM: IR PERCUTANEOUS ART THORMBECTOMY/INFUSION INTRACRANIAL INCLUDE DIAG ANGIO; IR CT HEAD LIMITED; RADIOLOGY EXAMINATION; IR ENDOVASCULAR INTRACRANIAL INFUSION OTHER THAN THROMBOLYSIS ARTERIAL INCLUDE DIAG ANGIO COMPARISON:  Diagnostic catheter arteriogram of October 03, 2019, and CT angiogram of the head and neck of October 01, 2019. MEDICATIONS: Heparin 1000 units IV; Ancef 2 g IV antibiotic was administered within 1 hour of the procedure. ANESTHESIA/SEDATION: General anesthesia. CONTRAST:  Isovue 300 approximately 90 mL. FLUOROSCOPY TIME:  Fluoroscopy Time: 78 minutes 36 seconds (3390 mGy). COMPLICATIONS: None immediate. TECHNIQUE: Informed written consent was obtained from the patient after a thorough discussion of the procedural risks, benefits and alternatives. All questions were addressed. Maximal Sterile Barrier Technique was utilized including caps, mask, sterile gowns, sterile gloves, sterile drape, hand hygiene and skin antiseptic. A timeout was performed prior to the initiation of the procedure. The right groin was prepped and draped in the usual sterile fashion. Thereafter using modified Seldinger technique, transfemoral access into the right common femoral artery was obtained without difficulty. Over a 0.035 inch guidewire, an 8 French 25 cm Pinnacle sheath was inserted. Through this, and also over 0.035 inch guidewire, a 5 [place] JB 1 catheter was advanced to the aortic arch region and selectively positioned in the right common carotid artery, the left common carotid artery and the left vertebral artery. FINDINGS: The right common carotid arteriogram demonstrates the right external carotid artery and its major branches to be widely  patent. The right internal carotid artery at the  bulb demonstrates a complete occlusion, with a delayed string sign opacification of the proximal 1/3 of the right internal carotid artery. More distally there is no evidence of reconstitution of the right ICA from the ipsilateral external carotid artery branches. ENDOVASCULAR REVASCULARIZATION OF THE OCCLUDED RIGHT INTERNAL CAROTID ARTERY EXTRA CRANIALLY WITH BALLOON ANGIOPLASTY, AND MECHANICAL THROMBECTOMY WITH ASPIRATION The diagnostic JB 1 catheter in right common carotid artery was exchanged over a 0.035 inch 300 cm Rosen exchange guidewire for a 95 cm 087 balloon guide catheter which was positioned just proximal to the right internal carotid artery origin. The guidewire was removed. Good aspiration was obtained from the balloon guide catheter. Over a 0.014 inch standard Synchro micro guidewire, a Trevo ProVue 021 microcatheter was then advanced just distal to the balloon guide in the right internal carotid artery bulb region. With a gentle manipulation, the micro guidewire could be advanced to the mid cervical right internal carotid artery. Further advancement of the micro guidewire was met with resistance. The micro guidewire was removed. A gentle control arteriogram performed through the microcatheter demonstrated improved opacification in the proximal right internal carotid artery at the level of the severe stenosis at the bulb. The microcatheter was then exchanged for a 014 inch 300 cm Transend softip exchange micro guidewire using biplane roadmap technique and constant fluoroscopic guidance. A 4 mm x 30 mm Viatrac 014 angioplasty balloon microcatheter which been prepped with 50% contrast and 50% heparinized saline infusion was then advanced and positioned adequate distant at the site of the severe stenosis. A control inflation was then performed of the balloon using micro inflation syringe device via micro tubing to normal pressure where it was maintained  for approximately 30 seconds. Balloon was then deflated and retrieved and removed. A control arteriogram performed through the balloon guide demonstrated now more string like flow into the distal right internal carotid artery. The microcatheter was reintroduced over the exchange micro guidewire which was removed. An 016 Fathom micro guidewire was then gently manipulated with the microcatheter to the cervical petrous junction. The guidewire was removed. Steering aspiration was obtained at the hub of the microcatheter. A gentle control arteriogram performed through the microcatheter demonstrated contrast mixed with copious amounts of the filling defects consistent with clot in the distal cervical left ICA extending into the petrous segment. A 6 mm x 40 mm Solitaire X retrieval device was then advanced to the distal end of the microcatheter. This was then deployed in the usual manner with the microcatheter retrieved more proximally. With proximal flow arrest in the right internal carotid artery, and constant aspiration with a penumbra aspiration device at the hub of the balloon guide catheter, the combination of the retrieval device, and the microcatheter were retrieved and removed. Aspiration was continued through the hub of the balloon guide. Small specks of dark of clots could be seen being aspirated into the canister. The balloon guide was then deflated and retrieved just proximal to the origin of the right internal carotid artery. An arteriogram performed at this site now demonstrated improved opacification into the right internal carotid artery extra cranially and to the level of the proximal petrous segment. An 027 Marksman microcatheter was then advanced over a 0.014 inch standard Synchro micro guidewire to the petrous cavernous junction. Aspiration was applied through the balloon guide at this site with a few specks of clot noted in the aspirate. Through the [facility] 027 microcatheter which was then advanced  at the petrous cavernous junction, initially an 014 inch and  then two 016 inch micro guidewires were used to advanced into the more distal cavernous and supraclinoid segments without success. A control arteriogram performed gently through the microcatheter demonstrated extensive firm circumferential filling defect extending from the proximal cavernous segment. No antegrade contrast was seen into the more distal cavernous segment. A control arteriogram performed through the microcatheter following multiple attempts to cross the firm severe occlusion of the caval cavernous segment, demonstrated contrast around the clot, but also contrast was noted into the adjacent tubular structures likely representing venous channels. 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 The balloon guide catheter was retrieved into the right common carotid artery and arteriogram performed at this site continued to demonstrate flow in the right external carotid artery branch, and also modestly improved flow in the right internal carotid artery proximally to the cranial skull base. The balloon guide was then retrieved and removed. A 5 French diagnostic catheter was reintroduced over a 0.035 inch Roadrunner guidewire, and select cannulation was performed through the left vertebral artery, and the left common carotid artery. The left vertebral artery arteriogram continued to demonstrate patency of the left vertebral artery proximally and also extending to the cranial skull base. Wide patency was maintained of the left vertebrobasilar junction, the basilar artery, the posterior cerebral arteries, the superior cerebellar arteries and the anterior-inferior cerebellar arteries into the capillary and venous phases. Prompt simultaneous retrograde opacification via the right posterior communicating artery was seen of the supraclinoid right ICA and the right middle and the  right anterior cerebral artery into the capillary and venous phases. No change was seen in the occluded inferior division of the right middle cerebral artery seen on a previous arteriogram corresponding to the patient's ischemic infarction in the right frontal cortical and subcortical regions. The left common carotid arteriogram continued to demonstrate wide patency of the left external carotid artery and the left internal carotid artery to the cranial skull base. The petrous, the cavernous and the supraclinoid segments remain patent. The left middle cerebral artery and the left anterior cerebral artery opacify into the capillary and venous phases. Minimal cross-filling via the anterior and posterior branches arising from the left pericallosal artery was noted. The diagnostic catheter was removed. The 8 French sheath in the right groin was then exchanged over a 0.035 inch Roadrunner guidewire for an 8 French Angio-Seal closure device. The right groin appeared soft. Distal pulses and the posterior tibial regions, and the dorsal petrous regions remained Dopplerable. CT scan of the brain demonstrated no evidence of intracranial hemorrhage, or mass effect or midline shift. Initially prior to the procedure, a CT performed on the table again demonstrates no evidence of hemorrhage at the site of the previous ischemic stroke in the right frontal region. The patient was then extubated without difficulty. Upon recovery, the patient was able to obey simple commands and was moving all fours with minimal left pronation drift. He denied, complaints of headaches, nausea, vomiting or visual changes. He was then transferred to the neuro ICU for post revascularization care. IMPRESSION: Status post endovascular revascularization of occluded right internal carotid artery proximally extra cranially with balloon angioplasty, 1 pass with a Solitaire X 6 mm x 40 mm retrieval device and Penumbra aspiration. Continued occlusion of the right  internal carotid artery distal cavernous segment. Reconstitution of the right middle cerebral artery and the right anterior cerebral artery via the anterior communicating artery, and the posterior communicating artery as described above. PLAN:  Follow-up in the clinic 4 weeks post discharge. Electronically Signed   By: Luanne Bras M.D.   On: 10/09/2019 12:48   IR US Guide Vasc Access Right  Result Date: 10/07/2019 CLINICAL DATA:  Recent right hemispheric ischemic infarct related to proximal right internal carotid artery near complete occlusion. EXAM: BILATERAL COMMON CAROTID AND INNOMINATE ANGIOGRAPHY; IR ANGIO VERTEBRAL SEL SUBCLAVIAN INNOMINATE UNI LEFT MOD SED; IR ANGIO VERTEBRAL SEL VERTEBRAL UNI RIGHT MOD SED; IR ULTRASOUND GUIDANCE VASC ACCESS RIGHT COMPARISON:  CTA of the head and neck of October 01, 2019. MEDICATIONS: Heparin 2000 units. No antibiotic was administered within 1 hour of the procedure. ANESTHESIA/SEDATION: Versed 1 mg IV; Fentanyl 50 mcg IV Moderate Sedation Time:  40 minutes The patient was continuously monitored during the procedure by the interventional radiology nurse under my direct supervision. CONTRAST:  Isovue 300 approximately 60 cc. FLUOROSCOPY TIME:  Fluoroscopy Time: 17 minutes 48 seconds 989 mGy). COMPLICATIONS: None immediate. TECHNIQUE: Informed written consent was obtained from the patient after a thorough discussion of the procedural risks, benefits and alternatives. All questions were addressed. Maximal Sterile Barrier Technique was utilized including caps, mask, sterile gowns, sterile gloves, sterile drape, hand hygiene and skin antiseptic. A timeout was performed prior to the initiation of the procedure. The right forearm to the wrist was prepped and draped in the usual sterile fashion. The right radial artery was identified, and its morphology documented with ultrasound. A dorsal palmar anastomosis was then verified to be present. Using ultrasound guidance, radial  access was obtained via a micropuncture set. Over a 0.018 inch micro guidewire, a 4/5 French radial sheath was then inserted. The obturator and the micro guidewire were removed. Good aspiration obtained from the side port of the sheath. A cocktail of 2000 units of heparin, 2.5 mg of verapamil, and 200 mcg of nitroglycerin was then infused in diluted form without event. A right radial arteriogram was obtained. Over a 0.035 inch Roadrunner guidewire, a 5 [place] Simmons 2 diagnostic catheter was then advanced to the aortic arch region, and selectively positioned in the right vertebral artery, the right common carotid artery, the left common carotid artery and the left subclavian artery. After the procedure, hemostasis in the right radial puncture site was achieved using a wrist band. Distal right radial pulse was verified to be present. FINDINGS: The origin of the right vertebral artery is widely patent. The vessel is seen to opacify to the cranial skull base. Wide patency is seen of the vertebrobasilar junction. The right vertebral artery terminates and the right posterior-inferior cerebellar artery and the right common carotid arteriogram demonstrates the right external carotid artery and its major branches to be widely patent. The right internal carotid artery at the bulb demonstrates near complete occlusion with a delayed string sign which ascends to the cranial skull base. More distally no reconstitution is seen of the right internal carotid artery from the external carotid artery branches. The left common carotid arteriogram demonstrates the left external carotid artery and its major branches to be widely patent. The left internal carotid artery at the bulb to the cranial skull base demonstrates wide patency. The petrous, the cavernous and the supraclinoid segments are widely patent with mild arteriosclerotic narrowing of the cavernous segments. The left middle cerebral artery and the left anterior cerebral  artery opacify into the capillary and venous phases. Mild fusiform dilatation of the distal M1 segment of the left middle cerebral artery. There is partial reconstitution of the right anterior cerebral artery distribution in  the A3 A4 regions from cross-filling from the distal pericallosal interhemispheric anastomosis. The left subclavian arteriogram demonstrates patency of the left vertebral artery. Mild atherosclerotic narrowing is seen of the proximal left vertebral artery. More distally, the vessel is seen to opacify to the cranial skull base. The left vertebrobasilar junction and the left posterior-inferior cerebellar artery opacify. The basilar artery, the posterior cerebral arteries, the superior cerebellar arteries and the anterior-inferior cerebellar arteries opacify into the capillary and venous phases. Prompt retrograde filling via the right posterior communicating artery of the supraclinoid right ICA is seen. More delayed images demonstrate opacification of the right middle cerebral artery and the right anterior cerebral artery proximal A1 segment. Occlusion of the superior division of the right middle cerebral artery is seen. IMPRESSION: Near complete occlusion of the right internal carotid artery at the bulb with an angiographic string sign proximally. Reconstitution of the right middle cerebral artery and the right anterior cerebral A1 segment from the left vertebral artery via the right posterior communicating artery. PLAN: Findings reviewed with the patient and the patient's neurologist. Electronically Signed   By: Luanne Bras M.D.   On: 10/03/2019 15:53   ECHOCARDIOGRAM COMPLETE  Result Date: 10/02/2019    ECHOCARDIOGRAM REPORT   Patient Name:   David Hendrix Date of Exam: 10/02/2019 Medical Rec #:  [id number]       Height:       70.0 in Accession #:    [id number]      Weight:       210.0 lb Date of Birth:  [birth year]       BSA:          2.131 m Patient Age:    54 years        BP:            136/54 mmHg Patient Gender: M               HR:           65 bpm. Exam Location:  Inpatient Procedure: 2D Echo Indications:    TIA 435.9 / G45.9  History:        Patient has no prior history of Echocardiogram examinations. No                 prior cardiac history.  Sonographer:    Vikki Ports Turrentine Referring Phys: [id number] Lequita Halt  Sonographer Comments: Poor patient compliance. IMPRESSIONS  1. Left ventricular ejection fraction, by estimation, is 60 to 65%. The left ventricle has normal function. The left ventricle has no regional wall motion abnormalities. There is mild concentric left ventricular hypertrophy. Left ventricular diastolic parameters are consistent with Grade I diastolic dysfunction (impaired relaxation).  2. Right ventricular systolic function is normal. The right ventricular size is normal. There is normal pulmonary artery systolic pressure.  3. The mitral valve is normal in structure. Trivial mitral valve regurgitation. No evidence of mitral stenosis.  4. The aortic valve is normal in structure. Aortic valve regurgitation is mild. No aortic stenosis is present.  5. The inferior vena cava is normal in size with greater than 50% respiratory variability, suggesting right atrial pressure of 3 mmHg. FINDINGS  Left Ventricle: Left ventricular ejection fraction, by estimation, is 60 to 65%. The left ventricle has normal function. The left ventricle has no regional wall motion abnormalities. The left ventricular internal cavity size was normal in size. There is  mild concentric left ventricular hypertrophy. Left ventricular diastolic parameters are consistent with Grade I  diastolic dysfunction (impaired relaxation). Normal left ventricular filling pressure. Right Ventricle: The right ventricular size is normal. No increase in right ventricular wall thickness. Right ventricular systolic function is normal. There is normal pulmonary artery systolic pressure. The tricuspid regurgitant velocity is 2.09  m/s, and  with an assumed right atrial pressure of 8 mmHg, the estimated right ventricular systolic pressure is 16.9 mmHg. Left Atrium: Left atrial size was normal in size. Right Atrium: Right atrial size was normal in size. Pericardium: There is no evidence of pericardial effusion. Mitral Valve: The mitral valve is normal in structure. Normal mobility of the mitral valve leaflets. Trivial mitral valve regurgitation. No evidence of mitral valve stenosis. Tricuspid Valve: The tricuspid valve is normal in structure. Tricuspid valve regurgitation is mild . No evidence of tricuspid stenosis. Aortic Valve: The aortic valve is normal in structure. Aortic valve regurgitation is mild. No aortic stenosis is present. Aortic valve mean gradient measures 5.0 mmHg. Aortic valve peak gradient measures 7.7 mmHg. Aortic valve area, by VTI measures 2.37 cm. Pulmonic Valve: The pulmonic valve was normal in structure. Pulmonic valve regurgitation is trivial. No evidence of pulmonic stenosis. Aorta: The aortic root is normal in size and structure. Venous: The inferior vena cava is normal in size with greater than 50% respiratory variability, suggesting right atrial pressure of 3 mmHg. IAS/Shunts: No atrial level shunt detected by color flow Doppler.  LEFT VENTRICLE PLAX 2D LVIDd:         4.60 cm  Diastology LVIDs:         3.09 cm  LV e' lateral:   9.68 cm/s LV PW:         1.11 cm  LV E/e' lateral: 7.4 LV IVS:        1.11 cm  LV e' medial:    7.72 cm/s LVOT diam:     2.00 cm  LV E/e' medial:  9.3 LV SV:         78 LV SV Index:   36 LVOT Area:     3.14 cm  RIGHT VENTRICLE RV S prime:     11.30 cm/s TAPSE (M-mode): 2.1 cm LEFT ATRIUM             Index       RIGHT ATRIUM           Index LA diam:        3.40 cm 1.60 cm/m  RA Area:     16.30 cm LA Vol (A2C):   36.4 ml 17.08 ml/m RA Volume:   43.70 ml  20.51 ml/m LA Vol (A4C):   40.3 ml 18.91 ml/m LA Biplane Vol: 41.1 ml 19.29 ml/m  AORTIC VALVE AV Area (Vmax):    2.62 cm AV Area  (Vmean):   2.37 cm AV Area (VTI):     2.37 cm AV Vmax:           139.00 cm/s AV Vmean:          105.000 cm/s AV VTI:            0.327 m AV Peak Grad:      7.7 mmHg AV Mean Grad:      5.0 mmHg LVOT Vmax:         116.00 cm/s LVOT Vmean:        79.100 cm/s LVOT VTI:          0.247 m LVOT/AV VTI ratio: 0.76  AORTA Ao Root diam: 3.50 cm MITRAL VALVE  TRICUSPID VALVE MV Area (PHT): 2.76 cm     TR Peak grad:   17.5 mmHg MV Decel Time: 275 msec     TR Vmax:        209.00 cm/s MV E velocity: 72.00 cm/s MV A velocity: 111.00 cm/s  SHUNTS MV E/A ratio:  0.65         Systemic VTI:  0.25 m                             Systemic Diam: 2.00 cm Fransico Him MD Electronically signed by Fransico Him MD Signature Date/Time: 10/02/2019/9:52:20 AM    Final    IR PERCUTANEOUS ART THROMBECTOMY/INFUSION INTRACRANIAL INC DIAG ANGIO  Result Date: 10/10/2019 CLINICAL DATA:  Right cerebral hemispheric ischemic infarct. 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 EXAM: IR PERCUTANEOUS ART THORMBECTOMY/INFUSION INTRACRANIAL INCLUDE DIAG ANGIO; IR CT HEAD LIMITED; RADIOLOGY EXAMINATION; IR ENDOVASCULAR INTRACRANIAL INFUSION OTHER THAN THROMBOLYSIS ARTERIAL INCLUDE DIAG ANGIO COMPARISON:  Diagnostic catheter arteriogram of October 03, 2019, and CT angiogram of the head and neck of October 01, 2019. MEDICATIONS: Heparin 1000 units IV; Ancef 2 g IV antibiotic was administered within 1 hour of the procedure. ANESTHESIA/SEDATION: General anesthesia. CONTRAST:  Isovue 300 approximately 90 mL. FLUOROSCOPY TIME:  Fluoroscopy Time: 78 minutes 36 seconds (3390 mGy). COMPLICATIONS: None immediate. TECHNIQUE: Informed written consent was obtained from the patient after a thorough discussion of the procedural risks, benefits and alternatives. All questions were addressed. Maximal Sterile Barrier Technique was utilized including caps, mask, sterile gowns, sterile gloves, sterile drape, hand hygiene and skin antiseptic. A  timeout was performed prior to the initiation of the procedure. The right groin was prepped and draped in the usual sterile fashion. Thereafter using modified Seldinger technique, transfemoral access into the right common femoral artery was obtained without difficulty. Over a 0.035 inch guidewire, an 8 French 25 cm Pinnacle sheath was inserted. Through this, and also over 0.035 inch guidewire, a 5 [place] JB 1 catheter was advanced to the aortic arch region and selectively positioned in the right common carotid artery, the left common carotid artery and the left vertebral artery. FINDINGS: The right common carotid arteriogram demonstrates the right external carotid artery and its major branches to be widely patent. The right internal carotid artery at the bulb demonstrates a complete occlusion, with a delayed string sign opacification of the proximal 1/3 of the right internal carotid artery. More distally there is no evidence of reconstitution of the right ICA from the ipsilateral external carotid artery branches. ENDOVASCULAR REVASCULARIZATION OF THE OCCLUDED RIGHT INTERNAL CAROTID ARTERY EXTRA CRANIALLY WITH BALLOON ANGIOPLASTY, AND MECHANICAL THROMBECTOMY WITH ASPIRATION The diagnostic JB 1 catheter in right common carotid artery was exchanged over a 0.035 inch 300 cm Rosen exchange guidewire for a 95 cm 087 balloon guide catheter which was positioned just proximal to the right internal carotid artery origin. The guidewire was removed. Good aspiration was obtained from the balloon guide catheter. Over a 0.014 inch standard Synchro micro guidewire, a Trevo ProVue 021 microcatheter was then advanced just distal to the balloon guide in the right internal carotid artery bulb region. With a gentle manipulation, the micro guidewire could be advanced to the mid cervical right internal carotid artery. Further advancement of the micro guidewire was met with resistance. The micro guidewire was removed. A gentle control  arteriogram performed through the microcatheter demonstrated improved opacification in the proximal right internal carotid artery at the  level of the severe stenosis at the bulb. The microcatheter was then exchanged for a 014 inch 300 cm Transend softip exchange micro guidewire using biplane roadmap technique and constant fluoroscopic guidance. A 4 mm x 30 mm Viatrac 014 angioplasty balloon microcatheter which been prepped with 50% contrast and 50% heparinized saline infusion was then advanced and positioned adequate distant at the site of the severe stenosis. A control inflation was then performed of the balloon using micro inflation syringe device via micro tubing to normal pressure where it was maintained for approximately 30 seconds. Balloon was then deflated and retrieved and removed. A control arteriogram performed through the balloon guide demonstrated now more string like flow into the distal right internal carotid artery. The microcatheter was reintroduced over the exchange micro guidewire which was removed. An 016 Fathom micro guidewire was then gently manipulated with the microcatheter to the cervical petrous junction. The guidewire was removed. Steering aspiration was obtained at the hub of the microcatheter. A gentle control arteriogram performed through the microcatheter demonstrated contrast mixed with copious amounts of the filling defects consistent with clot in the distal cervical left ICA extending into the petrous segment. A 6 mm x 40 mm Solitaire X retrieval device was then advanced to the distal end of the microcatheter. This was then deployed in the usual manner with the microcatheter retrieved more proximally. With proximal flow arrest in the right internal carotid artery, and constant aspiration with a penumbra aspiration device at the hub of the balloon guide catheter, the combination of the retrieval device, and the microcatheter were retrieved and removed. Aspiration was continued through  the hub of the balloon guide. Small specks of dark of clots could be seen being aspirated into the canister. The balloon guide was then deflated and retrieved just proximal to the origin of the right internal carotid artery. An arteriogram performed at this site now demonstrated improved opacification into the right internal carotid artery extra cranially and to the level of the proximal petrous segment. An 027 Marksman microcatheter was then advanced over a 0.014 inch standard Synchro micro guidewire to the petrous cavernous junction. Aspiration was applied through the balloon guide at this site with a few specks of clot noted in the aspirate. Through the [facility] 027 microcatheter which was then advanced at the petrous cavernous junction, initially an 014 inch and then two 016 inch micro guidewires were used to advanced into the more distal cavernous and supraclinoid segments without success. A control arteriogram performed gently through the microcatheter demonstrated extensive firm circumferential filling defect extending from the proximal cavernous segment. No antegrade contrast was seen into the more distal cavernous segment. A control arteriogram performed through the microcatheter following multiple attempts to cross the firm severe occlusion of the caval cavernous segment, demonstrated contrast around the clot, but also contrast was noted into the adjacent tubular structures likely representing venous channels. 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 The balloon guide catheter was retrieved into the right common carotid artery and arteriogram performed at this site continued to demonstrate flow in the right external carotid artery branch, and also modestly improved flow in the right internal carotid artery proximally to the cranial skull base. The balloon guide was then retrieved and removed. A 5 French diagnostic catheter was  reintroduced over a 0.035 inch Roadrunner guidewire, and select cannulation was performed through the left vertebral artery, and the left common carotid artery. The left vertebral artery arteriogram  continued to demonstrate patency of the left vertebral artery proximally and also extending to the cranial skull base. Wide patency was maintained of the left vertebrobasilar junction, the basilar artery, the posterior cerebral arteries, the superior cerebellar arteries and the anterior-inferior cerebellar arteries into the capillary and venous phases. Prompt simultaneous retrograde opacification via the right posterior communicating artery was seen of the supraclinoid right ICA and the right middle and the right anterior cerebral artery into the capillary and venous phases. No change was seen in the occluded inferior division of the right middle cerebral artery seen on a previous arteriogram corresponding to the patient's ischemic infarction in the right frontal cortical and subcortical regions. The left common carotid arteriogram continued to demonstrate wide patency of the left external carotid artery and the left internal carotid artery to the cranial skull base. The petrous, the cavernous and the supraclinoid segments remain patent. The left middle cerebral artery and the left anterior cerebral artery opacify into the capillary and venous phases. Minimal cross-filling via the anterior and posterior branches arising from the left pericallosal artery was noted. The diagnostic catheter was removed. The 8 French sheath in the right groin was then exchanged over a 0.035 inch Roadrunner guidewire for an 8 French Angio-Seal closure device. The right groin appeared soft. Distal pulses and the posterior tibial regions, and the dorsal petrous regions remained Dopplerable. CT scan of the brain demonstrated no evidence of intracranial hemorrhage, or mass effect or midline shift. Initially prior to the procedure, a CT performed  on the table again demonstrates no evidence of hemorrhage at the site of the previous ischemic stroke in the right frontal region. The patient was then extubated without difficulty. Upon recovery, the patient was able to obey simple commands and was moving all fours with minimal left pronation drift. He denied, complaints of headaches, nausea, vomiting or visual changes. He was then transferred to the neuro ICU for post revascularization care. IMPRESSION: Status post endovascular revascularization of occluded right internal carotid artery proximally extra cranially with balloon angioplasty, 1 pass with a Solitaire X 6 mm x 40 mm retrieval device and Penumbra aspiration. 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 Reconstitution of the right middle cerebral artery and the right anterior cerebral artery via the anterior communicating artery, and the posterior communicating artery as described above. PLAN: Follow-up in the clinic 4 weeks post discharge. Electronically Signed   By: Luanne Bras M.D.   On: 10/09/2019 12:48   IR ENDOVASC INTRACRANIAL INF OTHER THAN THROMBO ART INC DIAG ANGIO  Result Date: 10/10/2019 CLINICAL DATA:  Right cerebral hemispheric ischemic infarct. 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 EXAM: IR PERCUTANEOUS ART THORMBECTOMY/INFUSION INTRACRANIAL INCLUDE DIAG ANGIO; IR CT HEAD LIMITED; RADIOLOGY EXAMINATION; IR ENDOVASCULAR INTRACRANIAL INFUSION OTHER THAN THROMBOLYSIS ARTERIAL INCLUDE DIAG ANGIO COMPARISON:  Diagnostic catheter arteriogram of October 03, 2019, and CT angiogram of the head and neck of October 01, 2019. MEDICATIONS: Heparin 1000 units IV; Ancef 2 g IV antibiotic was administered within 1 hour of the procedure. ANESTHESIA/SEDATION: General anesthesia. CONTRAST:  Isovue 300 approximately 90 mL. FLUOROSCOPY TIME:  Fluoroscopy Time: 78 minutes 36 seconds (3390 mGy). COMPLICATIONS: None immediate. TECHNIQUE:  Informed written consent was obtained from the patient after a thorough discussion of the procedural risks, benefits and alternatives. All questions were addressed. Maximal Sterile Barrier Technique was utilized including caps, mask, sterile gowns, sterile gloves, sterile drape, hand hygiene and skin antiseptic. A timeout was performed prior to the initiation  of the procedure. The right groin was prepped and draped in the usual sterile fashion. Thereafter using modified Seldinger technique, transfemoral access into the right common femoral artery was obtained without difficulty. Over a 0.035 inch guidewire, an 8 French 25 cm Pinnacle sheath was inserted. Through this, and also over 0.035 inch guidewire, a 5 [place] JB 1 catheter was advanced to the aortic arch region and selectively positioned in the right common carotid artery, the left common carotid artery and the left vertebral artery. FINDINGS: The right common carotid arteriogram demonstrates the right external carotid artery and its major branches to be widely patent. The right internal carotid artery at the bulb demonstrates a complete occlusion, with a delayed string sign opacification of the proximal 1/3 of the right internal carotid artery. More distally there is no evidence of reconstitution of the right ICA from the ipsilateral external carotid artery branches. ENDOVASCULAR REVASCULARIZATION OF THE OCCLUDED RIGHT INTERNAL CAROTID ARTERY EXTRA CRANIALLY WITH BALLOON ANGIOPLASTY, AND MECHANICAL THROMBECTOMY WITH ASPIRATION The diagnostic JB 1 catheter in right common carotid artery was exchanged over a 0.035 inch 300 cm Rosen exchange guidewire for a 95 cm 087 balloon guide catheter which was positioned just proximal to the right internal carotid artery origin. The guidewire was removed. Good aspiration was obtained from the balloon guide catheter. Over a 0.014 inch standard Synchro micro guidewire, a Trevo ProVue 021 microcatheter was then advanced just  distal to the balloon guide in the right internal carotid artery bulb region. With a gentle manipulation, the micro guidewire could be advanced to the mid cervical right internal carotid artery. Further advancement of the micro guidewire was met with resistance. The micro guidewire was removed. A gentle control arteriogram performed through the microcatheter demonstrated improved opacification in the proximal right internal carotid artery at the level of the severe stenosis at the bulb. The microcatheter was then exchanged for a 014 inch 300 cm Transend softip exchange micro guidewire using biplane roadmap technique and constant fluoroscopic guidance. A 4 mm x 30 mm Viatrac 014 angioplasty balloon microcatheter which been prepped with 50% contrast and 50% heparinized saline infusion was then advanced and positioned adequate distant at the site of the severe stenosis. A control inflation was then performed of the balloon using micro inflation syringe device via micro tubing to normal pressure where it was maintained for approximately 30 seconds. Balloon was then deflated and retrieved and removed. A control arteriogram performed through the balloon guide demonstrated now more string like flow into the distal right internal carotid artery. The microcatheter was reintroduced over the exchange micro guidewire which was removed. An 016 Fathom micro guidewire was then gently manipulated with the microcatheter to the cervical petrous junction. The guidewire was removed. Steering aspiration was obtained at the hub of the microcatheter. A gentle control arteriogram performed through the microcatheter demonstrated contrast mixed with copious amounts of the filling defects consistent with clot in the distal cervical left ICA extending into the petrous segment. A 6 mm x 40 mm Solitaire X retrieval device was then advanced to the distal end of the microcatheter. This was then deployed in the usual manner with the microcatheter  retrieved more proximally. With proximal flow arrest in the right internal carotid artery, and constant aspiration with a penumbra aspiration device at the hub of the balloon guide catheter, the combination of the retrieval device, and the microcatheter were retrieved and removed. Aspiration was continued through the hub of the balloon guide. Small specks of dark of clots  could be seen being aspirated into the canister. The balloon guide was then deflated and retrieved just proximal to the origin of the right internal carotid artery. An arteriogram performed at this site now demonstrated improved opacification into the right internal carotid artery extra cranially and to the level of the proximal petrous segment. An 027 Marksman microcatheter was then advanced over a 0.014 inch standard Synchro micro guidewire to the petrous cavernous junction. Aspiration was applied through the balloon guide at this site with a few specks of clot noted in the aspirate. Through the [facility] 027 microcatheter which was then advanced at the petrous cavernous junction, initially an 014 inch and then two 016 inch micro guidewires were used to advanced into the more distal cavernous and supraclinoid segments without success. A control arteriogram performed gently through the microcatheter demonstrated extensive firm circumferential filling defect extending from the proximal cavernous segment. No antegrade contrast was seen into the more distal cavernous segment. A control arteriogram performed through the microcatheter following multiple attempts to cross the firm severe occlusion of the caval cavernous segment, demonstrated contrast around the clot, but also contrast was noted into the adjacent tubular structures likely representing venous channels. 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 The balloon guide catheter was retrieved into the right common  carotid artery and arteriogram performed at this site continued to demonstrate flow in the right external carotid artery branch, and also modestly improved flow in the right internal carotid artery proximally to the cranial skull base. The balloon guide was then retrieved and removed. A 5 French diagnostic catheter was reintroduced over a 0.035 inch Roadrunner guidewire, and select cannulation was performed through the left vertebral artery, and the left common carotid artery. The left vertebral artery arteriogram continued to demonstrate patency of the left vertebral artery proximally and also extending to the cranial skull base. Wide patency was maintained of the left vertebrobasilar junction, the basilar artery, the posterior cerebral arteries, the superior cerebellar arteries and the anterior-inferior cerebellar arteries into the capillary and venous phases. Prompt simultaneous retrograde opacification via the right posterior communicating artery was seen of the supraclinoid right ICA and the right middle and the right anterior cerebral artery into the capillary and venous phases. No change was seen in the occluded inferior division of the right middle cerebral artery seen on a previous arteriogram corresponding to the patient's ischemic infarction in the right frontal cortical and subcortical regions. The left common carotid arteriogram continued to demonstrate wide patency of the left external carotid artery and the left internal carotid artery to the cranial skull base. The petrous, the cavernous and the supraclinoid segments remain patent. The left middle cerebral artery and the left anterior cerebral artery opacify into the capillary and venous phases. Minimal cross-filling via the anterior and posterior branches arising from the left pericallosal artery was noted. The diagnostic catheter was removed. The 8 French sheath in the right groin was then exchanged over a 0.035 inch Roadrunner guidewire for an 8  French Angio-Seal closure device. The right groin appeared soft. Distal pulses and the posterior tibial regions, and the dorsal petrous regions remained Dopplerable. CT scan of the brain demonstrated no evidence of intracranial hemorrhage, or mass effect or midline shift. Initially prior to the procedure, a CT performed on the table again demonstrates no evidence of hemorrhage at the site of the previous ischemic stroke in the right frontal region. The patient was then extubated without difficulty.  Upon recovery, the patient was able to obey simple commands and was moving all fours with minimal left pronation drift. He denied, complaints of headaches, nausea, vomiting or visual changes. He was then transferred to the neuro ICU for post revascularization care. IMPRESSION: Status post endovascular revascularization of occluded right internal carotid artery proximally extra cranially with balloon angioplasty, 1 pass with a Solitaire X 6 mm x 40 mm retrieval device and Penumbra aspiration. 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 Reconstitution of the right middle cerebral artery and the right anterior cerebral artery via the anterior communicating artery, and the posterior communicating artery as described above. PLAN: Follow-up in the clinic 4 weeks post discharge. Electronically Signed   By: Luanne Bras M.D.   On: 10/09/2019 12:48   IR PTA NON CORO-LOWER EXTREM  Result Date: 10/10/2019 CLINICAL DATA:  Right cerebral hemispheric ischemic infarct. 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 EXAM: IR PERCUTANEOUS ART THORMBECTOMY/INFUSION INTRACRANIAL INCLUDE DIAG ANGIO; IR CT HEAD LIMITED; RADIOLOGY EXAMINATION; IR ENDOVASCULAR INTRACRANIAL INFUSION OTHER THAN THROMBOLYSIS ARTERIAL INCLUDE DIAG ANGIO COMPARISON:  Diagnostic catheter arteriogram of October 03, 2019, and CT angiogram of the head and neck of October 01, 2019. MEDICATIONS: Heparin  1000 units IV; Ancef 2 g IV antibiotic was administered within 1 hour of the procedure. ANESTHESIA/SEDATION: General anesthesia. CONTRAST:  Isovue 300 approximately 90 mL. FLUOROSCOPY TIME:  Fluoroscopy Time: 78 minutes 36 seconds (3390 mGy). COMPLICATIONS: None immediate. TECHNIQUE: Informed written consent was obtained from the patient after a thorough discussion of the procedural risks, benefits and alternatives. All questions were addressed. Maximal Sterile Barrier Technique was utilized including caps, mask, sterile gowns, sterile gloves, sterile drape, hand hygiene and skin antiseptic. A timeout was performed prior to the initiation of the procedure. The right groin was prepped and draped in the usual sterile fashion. Thereafter using modified Seldinger technique, transfemoral access into the right common femoral artery was obtained without difficulty. Over a 0.035 inch guidewire, an 8 French 25 cm Pinnacle sheath was inserted. Through this, and also over 0.035 inch guidewire, a 5 [place] JB 1 catheter was advanced to the aortic arch region and selectively positioned in the right common carotid artery, the left common carotid artery and the left vertebral artery. FINDINGS: The right common carotid arteriogram demonstrates the right external carotid artery and its major branches to be widely patent. The right internal carotid artery at the bulb demonstrates a complete occlusion, with a delayed string sign opacification of the proximal 1/3 of the right internal carotid artery. More distally there is no evidence of reconstitution of the right ICA from the ipsilateral external carotid artery branches. ENDOVASCULAR REVASCULARIZATION OF THE OCCLUDED RIGHT INTERNAL CAROTID ARTERY EXTRA CRANIALLY WITH BALLOON ANGIOPLASTY, AND MECHANICAL THROMBECTOMY WITH ASPIRATION The diagnostic JB 1 catheter in right common carotid artery was exchanged over a 0.035 inch 300 cm Rosen exchange guidewire for a 95 cm 087 balloon guide  catheter which was positioned just proximal to the right internal carotid artery origin. The guidewire was removed. Good aspiration was obtained from the balloon guide catheter. Over a 0.014 inch standard Synchro micro guidewire, a Trevo ProVue 021 microcatheter was then advanced just distal to the balloon guide in the right internal carotid artery bulb region. With a gentle manipulation, the micro guidewire could be advanced to the mid cervical right internal carotid artery. Further advancement of the micro guidewire was met with resistance. The micro guidewire was removed. A gentle control arteriogram performed through  the microcatheter demonstrated improved opacification in the proximal right internal carotid artery at the level of the severe stenosis at the bulb. The microcatheter was then exchanged for a 014 inch 300 cm Transend softip exchange micro guidewire using biplane roadmap technique and constant fluoroscopic guidance. A 4 mm x 30 mm Viatrac 014 angioplasty balloon microcatheter which been prepped with 50% contrast and 50% heparinized saline infusion was then advanced and positioned adequate distant at the site of the severe stenosis. A control inflation was then performed of the balloon using micro inflation syringe device via micro tubing to normal pressure where it was maintained for approximately 30 seconds. Balloon was then deflated and retrieved and removed. A control arteriogram performed through the balloon guide demonstrated now more string like flow into the distal right internal carotid artery. The microcatheter was reintroduced over the exchange micro guidewire which was removed. An 016 Fathom micro guidewire was then gently manipulated with the microcatheter to the cervical petrous junction. The guidewire was removed. Steering aspiration was obtained at the hub of the microcatheter. A gentle control arteriogram performed through the microcatheter demonstrated contrast mixed with copious  amounts of the filling defects consistent with clot in the distal cervical left ICA extending into the petrous segment. A 6 mm x 40 mm Solitaire X retrieval device was then advanced to the distal end of the microcatheter. This was then deployed in the usual manner with the microcatheter retrieved more proximally. With proximal flow arrest in the right internal carotid artery, and constant aspiration with a penumbra aspiration device at the hub of the balloon guide catheter, the combination of the retrieval device, and the microcatheter were retrieved and removed. Aspiration was continued through the hub of the balloon guide. Small specks of dark of clots could be seen being aspirated into the canister. The balloon guide was then deflated and retrieved just proximal to the origin of the right internal carotid artery. An arteriogram performed at this site now demonstrated improved opacification into the right internal carotid artery extra cranially and to the level of the proximal petrous segment. An 027 Marksman microcatheter was then advanced over a 0.014 inch standard Synchro micro guidewire to the petrous cavernous junction. Aspiration was applied through the balloon guide at this site with a few specks of clot noted in the aspirate. Through the [facility] 027 microcatheter which was then advanced at the petrous cavernous junction, initially an 014 inch and then two 016 inch micro guidewires were used to advanced into the more distal cavernous and supraclinoid segments without success. A control arteriogram performed gently through the microcatheter demonstrated extensive firm circumferential filling defect extending from the proximal cavernous segment. No antegrade contrast was seen into the more distal cavernous segment. A control arteriogram performed through the microcatheter following multiple attempts to cross the firm severe occlusion of the caval cavernous segment, demonstrated contrast around the clot, but  also contrast was noted into the adjacent tubular structures likely representing venous channels. 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 The balloon guide catheter was retrieved into the right common carotid artery and arteriogram performed at this site continued to demonstrate flow in the right external carotid artery branch, and also modestly improved flow in the right internal carotid artery proximally to the cranial skull base. The balloon guide was then retrieved and removed. A 5 French diagnostic catheter was reintroduced over a 0.035 inch Roadrunner guidewire, and select cannulation was performed through the  left vertebral artery, and the left common carotid artery. The left vertebral artery arteriogram continued to demonstrate patency of the left vertebral artery proximally and also extending to the cranial skull base. Wide patency was maintained of the left vertebrobasilar junction, the basilar artery, the posterior cerebral arteries, the superior cerebellar arteries and the anterior-inferior cerebellar arteries into the capillary and venous phases. Prompt simultaneous retrograde opacification via the right posterior communicating artery was seen of the supraclinoid right ICA and the right middle and the right anterior cerebral artery into the capillary and venous phases. No change was seen in the occluded inferior division of the right middle cerebral artery seen on a previous arteriogram corresponding to the patient's ischemic infarction in the right frontal cortical and subcortical regions. The left common carotid arteriogram continued to demonstrate wide patency of the left external carotid artery and the left internal carotid artery to the cranial skull base. The petrous, the cavernous and the supraclinoid segments remain patent. The left middle cerebral artery and the left anterior cerebral artery opacify into the  capillary and venous phases. Minimal cross-filling via the anterior and posterior branches arising from the left pericallosal artery was noted. The diagnostic catheter was removed. The 8 French sheath in the right groin was then exchanged over a 0.035 inch Roadrunner guidewire for an 8 French Angio-Seal closure device. The right groin appeared soft. Distal pulses and the posterior tibial regions, and the dorsal petrous regions remained Dopplerable. CT scan of the brain demonstrated no evidence of intracranial hemorrhage, or mass effect or midline shift. Initially prior to the procedure, a CT performed on the table again demonstrates no evidence of hemorrhage at the site of the previous ischemic stroke in the right frontal region. The patient was then extubated without difficulty. Upon recovery, the patient was able to obey simple commands and was moving all fours with minimal left pronation drift. He denied, complaints of headaches, nausea, vomiting or visual changes. He was then transferred to the neuro ICU for post revascularization care. IMPRESSION: Status post endovascular revascularization of occluded right internal carotid artery proximally extra cranially with balloon angioplasty, 1 pass with a Solitaire X 6 mm x 40 mm retrieval device and Penumbra aspiration. 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 Reconstitution of the right middle cerebral artery and the right anterior cerebral artery via the anterior communicating artery, and the posterior communicating artery as described above. PLAN: Follow-up in the clinic 4 weeks post discharge. Electronically Signed   By: Luanne Bras M.D.   On: 10/09/2019 12:48   VAS US CAROTID  Result Date: 10/02/2019 Carotid Arterial Duplex Study Indications:   CVA and Carotid artery disease. Other Factors: CT showed occluded ICA. Performing Technologist: June Leap RDMS, RVT  Examination Guidelines: A complete evaluation  includes B-mode imaging, spectral Doppler, color Doppler, and power Doppler as needed of all accessible portions of each vessel. Bilateral testing is considered an integral part of a complete examination. Limited examinations for reoccurring indications may be performed as noted.  Right Carotid Findings: +----------+--------+--------+--------+------------------+---------------------+           PSV cm/sEDV cm/sStenosisPlaque DescriptionComments              +----------+--------+--------+--------+------------------+---------------------+ CCA Prox  81      9                                                       +----------+--------+--------+--------+------------------+---------------------+  CCA Distal42      5                                                       +----------+--------+--------+--------+------------------+---------------------+ ICA Prox  99              Occluded                  near occlusion,                                                           minimal flow noted                                                        proximally            +----------+--------+--------+--------+------------------+---------------------+ ECA       183     19                                                      +----------+--------+--------+--------+------------------+---------------------+ +----------+--------+-------+--------+-------------------+           PSV cm/sEDV cmsDescribeArm Pressure (mmHG) +----------+--------+-------+--------+-------------------+ [id number]            Stenotic                    +----------+--------+-------+--------+-------------------+ +---------+--------+--+--------+-+---------+ VertebralPSV cm/s74EDV cm/s5Antegrade +---------+--------+--+--------+-+---------+  Left Carotid Findings: +----------+--------+--------+--------+------------------+--------+           PSV cm/sEDV cm/sStenosisPlaque DescriptionComments  +----------+--------+--------+--------+------------------+--------+ CCA Prox  96      19                                         +----------+--------+--------+--------+------------------+--------+ CCA Distal69      21                                         +----------+--------+--------+--------+------------------+--------+ ICA Prox  58      21      1-39%   heterogenous               +----------+--------+--------+--------+------------------+--------+ ICA Distal107     30                                         +----------+--------+--------+--------+------------------+--------+ ECA       169     11                                         +----------+--------+--------+--------+------------------+--------+ +----------+--------+--------+----------------+-------------------+  PSV cm/sEDV cm/sDescribe        Arm Pressure (mmHG) +----------+--------+--------+----------------+-------------------+ Subclavian217             Multiphasic, WNL                    +----------+--------+--------+----------------+-------------------+ +---------+--------+--+--------+--+---------+ VertebralPSV cm/s91EDV cm/s29Antegrade +---------+--------+--+--------+--+---------+   Summary: Right Carotid: Evidence consistent with a total occlusion of the right ICA. Left Carotid: Velocities in the left ICA are consistent with a 1-39% stenosis. Vertebrals:  Bilateral vertebral arteries demonstrate antegrade flow. Subclavians: Normal flow hemodynamics were seen in bilateral subclavian              arteries. *See table(s) above for measurements and observations.  Electronically signed by Antony Contras MD on 10/02/2019 at 5:22:55 PM.    Final    IR ANGIO INTRA EXTRACRAN SEL COM CAROTID INNOMINATE BILAT MOD SED  Result Date: 10/07/2019 CLINICAL DATA:  Recent right hemispheric ischemic infarct related to proximal right internal carotid artery near complete occlusion. EXAM: BILATERAL COMMON  CAROTID AND INNOMINATE ANGIOGRAPHY; IR ANGIO VERTEBRAL SEL SUBCLAVIAN INNOMINATE UNI LEFT MOD SED; IR ANGIO VERTEBRAL SEL VERTEBRAL UNI RIGHT MOD SED; IR ULTRASOUND GUIDANCE VASC ACCESS RIGHT COMPARISON:  CTA of the head and neck of October 01, 2019. MEDICATIONS: Heparin 2000 units. No antibiotic was administered within 1 hour of the procedure. ANESTHESIA/SEDATION: Versed 1 mg IV; Fentanyl 50 mcg IV Moderate Sedation Time:  40 minutes The patient was continuously monitored during the procedure by the interventional radiology nurse under my direct supervision. CONTRAST:  Isovue 300 approximately 60 cc. FLUOROSCOPY TIME:  Fluoroscopy Time: 17 minutes 48 seconds 989 mGy). COMPLICATIONS: None immediate. TECHNIQUE: Informed written consent was obtained from the patient after a thorough discussion of the procedural risks, benefits and alternatives. All questions were addressed. Maximal Sterile Barrier Technique was utilized including caps, mask, sterile gowns, sterile gloves, sterile drape, hand hygiene and skin antiseptic. A timeout was performed prior to the initiation of the procedure. The right forearm to the wrist was prepped and draped in the usual sterile fashion. The right radial artery was identified, and its morphology documented with ultrasound. A dorsal palmar anastomosis was then verified to be present. Using ultrasound guidance, radial access was obtained via a micropuncture set. Over a 0.018 inch micro guidewire, a 4/5 French radial sheath was then inserted. The obturator and the micro guidewire were removed. Good aspiration obtained from the side port of the sheath. A cocktail of 2000 units of heparin, 2.5 mg of verapamil, and 200 mcg of nitroglycerin was then infused in diluted form without event. A right radial arteriogram was obtained. Over a 0.035 inch Roadrunner guidewire, a 5 [place] Simmons 2 diagnostic catheter was then advanced to the aortic arch region, and selectively positioned in the right  vertebral artery, the right common carotid artery, the left common carotid artery and the left subclavian artery. After the procedure, hemostasis in the right radial puncture site was achieved using a wrist band. Distal right radial pulse was verified to be present. FINDINGS: The origin of the right vertebral artery is widely patent. The vessel is seen to opacify to the cranial skull base. Wide patency is seen of the vertebrobasilar junction. The right vertebral artery terminates and the right posterior-inferior cerebellar artery and the right common carotid arteriogram demonstrates the right external carotid artery and its major branches to be widely patent. The right internal carotid artery at the bulb demonstrates near complete occlusion with a delayed string sign which ascends to  the cranial skull base. More distally no reconstitution is seen of the right internal carotid artery from the external carotid artery branches. The left common carotid arteriogram demonstrates the left external carotid artery and its major branches to be widely patent. The left internal carotid artery at the bulb to the cranial skull base demonstrates wide patency. The petrous, the cavernous and the supraclinoid segments are widely patent with mild arteriosclerotic narrowing of the cavernous segments. The left middle cerebral artery and the left anterior cerebral artery opacify into the capillary and venous phases. Mild fusiform dilatation of the distal M1 segment of the left middle cerebral artery. There is partial reconstitution of the right anterior cerebral artery distribution in the A3 A4 regions from cross-filling from the distal pericallosal interhemispheric anastomosis. The left subclavian arteriogram demonstrates patency of the left vertebral artery. Mild atherosclerotic narrowing is seen of the proximal left vertebral artery. More distally, the vessel is seen to opacify to the cranial skull base. The left vertebrobasilar  junction and the left posterior-inferior cerebellar artery opacify. The basilar artery, the posterior cerebral arteries, the superior cerebellar arteries and the anterior-inferior cerebellar arteries opacify into the capillary and venous phases. Prompt retrograde filling via the right posterior communicating artery of the supraclinoid right ICA is seen. More delayed images demonstrate opacification of the right middle cerebral artery and the right anterior cerebral artery proximal A1 segment. Occlusion of the superior division of the right middle cerebral artery is seen. IMPRESSION: Near complete occlusion of the right internal carotid artery at the bulb with an angiographic string sign proximally. Reconstitution of the right middle cerebral artery and the right anterior cerebral A1 segment from the left vertebral artery via the right posterior communicating artery. PLAN: Findings reviewed with the patient and the patient's neurologist. Electronically Signed   By: Luanne Bras M.D.   On: 10/03/2019 15:53   IR ANGIO VERTEBRAL SEL SUBCLAVIAN INNOMINATE UNI L MOD SED  Result Date: 10/07/2019 CLINICAL DATA:  Recent right hemispheric ischemic infarct related to proximal right internal carotid artery near complete occlusion. EXAM: BILATERAL COMMON CAROTID AND INNOMINATE ANGIOGRAPHY; IR ANGIO VERTEBRAL SEL SUBCLAVIAN INNOMINATE UNI LEFT MOD SED; IR ANGIO VERTEBRAL SEL VERTEBRAL UNI RIGHT MOD SED; IR ULTRASOUND GUIDANCE VASC ACCESS RIGHT COMPARISON:  CTA of the head and neck of October 01, 2019. MEDICATIONS: Heparin 2000 units. No antibiotic was administered within 1 hour of the procedure. ANESTHESIA/SEDATION: Versed 1 mg IV; Fentanyl 50 mcg IV Moderate Sedation Time:  40 minutes The patient was continuously monitored during the procedure by the interventional radiology nurse under my direct supervision. CONTRAST:  Isovue 300 approximately 60 cc. FLUOROSCOPY TIME:  Fluoroscopy Time: 17 minutes 48 seconds 989 mGy).  COMPLICATIONS: None immediate. TECHNIQUE: Informed written consent was obtained from the patient after a thorough discussion of the procedural risks, benefits and alternatives. All questions were addressed. Maximal Sterile Barrier Technique was utilized including caps, mask, sterile gowns, sterile gloves, sterile drape, hand hygiene and skin antiseptic. A timeout was performed prior to the initiation of the procedure. The right forearm to the wrist was prepped and draped in the usual sterile fashion. The right radial artery was identified, and its morphology documented with ultrasound. A dorsal palmar anastomosis was then verified to be present. Using ultrasound guidance, radial access was obtained via a micropuncture set. Over a 0.018 inch micro guidewire, a 4/5 French radial sheath was then inserted. The obturator and the micro guidewire were removed. Good aspiration obtained from the side port of the sheath.  A cocktail of 2000 units of heparin, 2.5 mg of verapamil, and 200 mcg of nitroglycerin was then infused in diluted form without event. A right radial arteriogram was obtained. Over a 0.035 inch Roadrunner guidewire, a 5 [place] Simmons 2 diagnostic catheter was then advanced to the aortic arch region, and selectively positioned in the right vertebral artery, the right common carotid artery, the left common carotid artery and the left subclavian artery. After the procedure, hemostasis in the right radial puncture site was achieved using a wrist band. Distal right radial pulse was verified to be present. FINDINGS: The origin of the right vertebral artery is widely patent. The vessel is seen to opacify to the cranial skull base. Wide patency is seen of the vertebrobasilar junction. The right vertebral artery terminates and the right posterior-inferior cerebellar artery and the right common carotid arteriogram demonstrates the right external carotid artery and its major branches to be widely patent. The right  internal carotid artery at the bulb demonstrates near complete occlusion with a delayed string sign which ascends to the cranial skull base. More distally no reconstitution is seen of the right internal carotid artery from the external carotid artery branches. The left common carotid arteriogram demonstrates the left external carotid artery and its major branches to be widely patent. The left internal carotid artery at the bulb to the cranial skull base demonstrates wide patency. The petrous, the cavernous and the supraclinoid segments are widely patent with mild arteriosclerotic narrowing of the cavernous segments. The left middle cerebral artery and the left anterior cerebral artery opacify into the capillary and venous phases. Mild fusiform dilatation of the distal M1 segment of the left middle cerebral artery. There is partial reconstitution of the right anterior cerebral artery distribution in the A3 A4 regions from cross-filling from the distal pericallosal interhemispheric anastomosis. The left subclavian arteriogram demonstrates patency of the left vertebral artery. Mild atherosclerotic narrowing is seen of the proximal left vertebral artery. More distally, the vessel is seen to opacify to the cranial skull base. The left vertebrobasilar junction and the left posterior-inferior cerebellar artery opacify. The basilar artery, the posterior cerebral arteries, the superior cerebellar arteries and the anterior-inferior cerebellar arteries opacify into the capillary and venous phases. Prompt retrograde filling via the right posterior communicating artery of the supraclinoid right ICA is seen. More delayed images demonstrate opacification of the right middle cerebral artery and the right anterior cerebral artery proximal A1 segment. Occlusion of the superior division of the right middle cerebral artery is seen. IMPRESSION: Near complete occlusion of the right internal carotid artery at the bulb with an angiographic  string sign proximally. Reconstitution of the right middle cerebral artery and the right anterior cerebral A1 segment from the left vertebral artery via the right posterior communicating artery. PLAN: Findings reviewed with the patient and the patient's neurologist. Electronically Signed   By: Luanne Bras M.D.   On: 10/03/2019 15:53   IR ANGIO VERTEBRAL SEL VERTEBRAL UNI R MOD SED  Result Date: 10/07/2019 CLINICAL DATA:  Recent right hemispheric ischemic infarct related to proximal right internal carotid artery near complete occlusion. EXAM: BILATERAL COMMON CAROTID AND INNOMINATE ANGIOGRAPHY; IR ANGIO VERTEBRAL SEL SUBCLAVIAN INNOMINATE UNI LEFT MOD SED; IR ANGIO VERTEBRAL SEL VERTEBRAL UNI RIGHT MOD SED; IR ULTRASOUND GUIDANCE VASC ACCESS RIGHT COMPARISON:  CTA of the head and neck of October 01, 2019. MEDICATIONS: Heparin 2000 units. No antibiotic was administered within 1 hour of the procedure. ANESTHESIA/SEDATION: Versed 1 mg IV; Fentanyl 50 mcg IV  Moderate Sedation Time:  40 minutes The patient was continuously monitored during the procedure by the interventional radiology nurse under my direct supervision. CONTRAST:  Isovue 300 approximately 60 cc. FLUOROSCOPY TIME:  Fluoroscopy Time: 17 minutes 48 seconds 989 mGy). COMPLICATIONS: None immediate. TECHNIQUE: Informed written consent was obtained from the patient after a thorough discussion of the procedural risks, benefits and alternatives. All questions were addressed. Maximal Sterile Barrier Technique was utilized including caps, mask, sterile gowns, sterile gloves, sterile drape, hand hygiene and skin antiseptic. A timeout was performed prior to the initiation of the procedure. The right forearm to the wrist was prepped and draped in the usual sterile fashion. The right radial artery was identified, and its morphology documented with ultrasound. A dorsal palmar anastomosis was then verified to be present. Using ultrasound guidance, radial access was  obtained via a micropuncture set. Over a 0.018 inch micro guidewire, a 4/5 French radial sheath was then inserted. The obturator and the micro guidewire were removed. Good aspiration obtained from the side port of the sheath. A cocktail of 2000 units of heparin, 2.5 mg of verapamil, and 200 mcg of nitroglycerin was then infused in diluted form without event. A right radial arteriogram was obtained. Over a 0.035 inch Roadrunner guidewire, a 5 [place] Simmons 2 diagnostic catheter was then advanced to the aortic arch region, and selectively positioned in the right vertebral artery, the right common carotid artery, the left common carotid artery and the left subclavian artery. After the procedure, hemostasis in the right radial puncture site was achieved using a wrist band. Distal right radial pulse was verified to be present. FINDINGS: The origin of the right vertebral artery is widely patent. The vessel is seen to opacify to the cranial skull base. Wide patency is seen of the vertebrobasilar junction. The right vertebral artery terminates and the right posterior-inferior cerebellar artery and the right common carotid arteriogram demonstrates the right external carotid artery and its major branches to be widely patent. The right internal carotid artery at the bulb demonstrates near complete occlusion with a delayed string sign which ascends to the cranial skull base. More distally no reconstitution is seen of the right internal carotid artery from the external carotid artery branches. The left common carotid arteriogram demonstrates the left external carotid artery and its major branches to be widely patent. The left internal carotid artery at the bulb to the cranial skull base demonstrates wide patency. The petrous, the cavernous and the supraclinoid segments are widely patent with mild arteriosclerotic narrowing of the cavernous segments. The left middle cerebral artery and the left anterior cerebral artery opacify  into the capillary and venous phases. Mild fusiform dilatation of the distal M1 segment of the left middle cerebral artery. There is partial reconstitution of the right anterior cerebral artery distribution in the A3 A4 regions from cross-filling from the distal pericallosal interhemispheric anastomosis. The left subclavian arteriogram demonstrates patency of the left vertebral artery. Mild atherosclerotic narrowing is seen of the proximal left vertebral artery. More distally, the vessel is seen to opacify to the cranial skull base. The left vertebrobasilar junction and the left posterior-inferior cerebellar artery opacify. The basilar artery, the posterior cerebral arteries, the superior cerebellar arteries and the anterior-inferior cerebellar arteries opacify into the capillary and venous phases. Prompt retrograde filling via the right posterior communicating artery of the supraclinoid right ICA is seen. More delayed images demonstrate opacification of the right middle cerebral artery and the right anterior cerebral artery proximal A1 segment. Occlusion of  the superior division of the right middle cerebral artery is seen. IMPRESSION: Near complete occlusion of the right internal carotid artery at the bulb with an angiographic string sign proximally. Reconstitution of the right middle cerebral artery and the right anterior cerebral A1 segment from the left vertebral artery via the right posterior communicating artery. PLAN: Findings reviewed with the patient and the patient's neurologist. Electronically Signed   By: Luanne Bras M.D.   On: 10/03/2019 15:53       Subjective: Wants to go home, no new complaints.   Discharge Exam: Vitals:   10/10/19 1300 10/10/19 1400  BP: (!) 153/81 128/83  Pulse:    Resp: 18 (!) 21  Temp:    SpO2:     Vitals:   10/10/19 1126 10/10/19 1200 10/10/19 1300 10/10/19 1400  BP:  (!) 148/69 (!) 153/81 128/83  Pulse:  78    Resp:  20 18 (!) 21  Temp: 98.6 F (37  C)     TempSrc: Oral     SpO2:  100%    Weight:      Height:        General: Pt is alert, awake, not in acute distress Cardiovascular: RRR, S1/S2 +, no rubs, no gallops Respiratory: CTA bilaterally, no wheezing, no rhonchi Abdominal: Soft, NT, ND, bowel sounds + Extremities: no edema, no cyanosis    The results of significant diagnostics from this hospitalization (including imaging, microbiology, ancillary and laboratory) are listed below for reference.     Microbiology: Recent Results (from the past 240 hour(s))  Respiratory Panel by RT PCR (Flu A&B, Covid) - Nasopharyngeal Swab     Status: None   Collection Time: 10/01/19  4:30 PM   Specimen: Nasopharyngeal Swab  Result Value Ref Range Status   SARS Coronavirus 2 by RT PCR NEGATIVE NEGATIVE Final    Comment: (NOTE) SARS-CoV-2 target nucleic acids are NOT DETECTED. The SARS-CoV-2 RNA is generally detectable in upper respiratoy specimens during the acute phase of infection. The lowest concentration of SARS-CoV-2 viral copies this assay can detect is 131 copies/mL. A negative result does not preclude SARS-Cov-2 infection and should not be used as the sole basis for treatment or other patient management decisions. A negative result may occur with  improper specimen collection/handling, submission of specimen other than nasopharyngeal swab, presence of viral mutation(s) within the areas targeted by this assay, and inadequate number of viral copies (<131 copies/mL). A negative result must be combined with clinical observations, patient history, and epidemiological information. The expected result is Negative. Fact Sheet for Patients:  PinkCheek.be Fact Sheet for Healthcare Providers:  GravelBags.it This test is not yet ap proved or cleared by the [place] FDA and  has been authorized for detection and/or diagnosis of SARS-CoV-2 by FDA under an Emergency Use  Authorization (EUA). This EUA will remain  in effect (meaning this test can be used) for the duration of the COVID-19 declaration under Section 564(b)(1) of the Act, 21 U.S.C. section 360bbb-3(b)(1), unless the authorization is terminated or revoked sooner.    Influenza A by PCR NEGATIVE NEGATIVE Final   Influenza B by PCR NEGATIVE NEGATIVE Final    Comment: (NOTE) The Xpert Xpress SARS-CoV-2/FLU/RSV assay is intended as an aid in  the diagnosis of influenza from Nasopharyngeal swab specimens and  should not be used as a sole basis for treatment. Nasal washings and  aspirates are unacceptable for Xpert Xpress SARS-CoV-2/FLU/RSV  testing. Fact Sheet for Patients: PinkCheek.be Fact Sheet for Healthcare Providers:  GravelBags.it This test is not yet approved or cleared by the [place] and  has been authorized for detection and/or diagnosis of SARS-CoV-2 by  FDA under an Emergency Use Authorization (EUA). This EUA will remain  in effect (meaning this test can be used) for the duration of the  Covid-19 declaration under Section 564(b)(1) of the Act, 21  U.S.C. section 360bbb-3(b)(1), unless the authorization is  terminated or revoked. Performed at [facility], [place] [address], [place], [place] [address]   Respiratory Panel by RT PCR (Flu A&B, Covid) - Nasopharyngeal Swab     Status: None   Collection Time: 10/07/19 10:57 AM   Specimen: Nasopharyngeal Swab  Result Value Ref Range Status   SARS Coronavirus 2 by RT PCR NEGATIVE NEGATIVE Final    Comment: (NOTE) SARS-CoV-2 target nucleic acids are NOT DETECTED. The SARS-CoV-2 RNA is generally detectable in upper respiratoy specimens during the acute phase of infection. The lowest concentration of SARS-CoV-2 viral copies this assay can detect is 131 copies/mL. A negative result does not preclude SARS-Cov-2 infection and should not be used as the sole basis for  treatment or other patient management decisions. A negative result may occur with  improper specimen collection/handling, submission of specimen other than nasopharyngeal swab, presence of viral mutation(s) within the areas targeted by this assay, and inadequate number of viral copies (<131 copies/mL). A negative result must be combined with clinical observations, patient history, and epidemiological information. The expected result is Negative. Fact Sheet for Patients:  PinkCheek.be Fact Sheet for Healthcare Providers:  GravelBags.it This test is not yet ap proved or cleared by the [place] FDA and  has been authorized for detection and/or diagnosis of SARS-CoV-2 by FDA under an Emergency Use Authorization (EUA). This EUA will remain  in effect (meaning this test can be used) for the duration of the COVID-19 declaration under Section 564(b)(1) of the Act, 21 U.S.C. section 360bbb-3(b)(1), unless the authorization is terminated or revoked sooner.    Influenza A by PCR NEGATIVE NEGATIVE Final   Influenza B by PCR NEGATIVE NEGATIVE Final    Comment: (NOTE) The Xpert Xpress SARS-CoV-2/FLU/RSV assay is intended as an aid in  the diagnosis of influenza from Nasopharyngeal swab specimens and  should not be used as a sole basis for treatment. Nasal washings and  aspirates are unacceptable for Xpert Xpress SARS-CoV-2/FLU/RSV  testing. Fact Sheet for Patients: PinkCheek.be Fact Sheet for Healthcare Providers: GravelBags.it This test is not yet approved or cleared by the [place] FDA and  has been authorized for detection and/or diagnosis of SARS-CoV-2 by  FDA under an Emergency Use Authorization (EUA). This EUA will remain  in effect (meaning this test can be used) for the duration of the  Covid-19 declaration under Section 564(b)(1) of the Act, 21  U.S.C. section  360bbb-3(b)(1), unless the authorization is  terminated or revoked. Performed at [facility], [place] [address] [place], [place] [address]   MRSA PCR Screening     Status: None   Collection Time: 10/08/19  4:55 PM   Specimen: Nasal Mucosa; Nasopharyngeal  Result Value Ref Range Status   MRSA by PCR NEGATIVE NEGATIVE Final    Comment:        The GeneXpert MRSA Assay (FDA approved for NASAL specimens only), is one component of a comprehensive MRSA colonization surveillance program. It is not intended to diagnose MRSA infection nor to guide or monitor treatment for MRSA infections. Performed at [facility], [address] [place],  [address]      Labs: BNP (last 3 results) No results for input(s): BNP in the last 8760 hours. Basic Metabolic Panel: Recent Labs  Lab 10/05/19 0012 10/07/19 1030 10/09/19 0609 10/10/19 1035  NA 136 135 137 134*  K 4.0 4.2 3.4* 4.1  CL 101 102 104 107  CO2 25 21* 19* 16*  GLUCOSE 146* 137* 162* 172*  BUN 14 14 9 14   CREATININE 0.77 0.66 0.55* 0.75  CALCIUM 9.5 9.6 8.6* 8.6*   Liver Function Tests: Recent Labs  Lab 10/05/19 0012  AST 15  ALT 17  ALKPHOS 71  BILITOT 0.8  PROT 8.0  ALBUMIN 4.0   No results for input(s): LIPASE, AMYLASE in the last 168 hours. No results for input(s): AMMONIA in the last 168 hours. CBC: Recent Labs  Lab 10/05/19 0012 10/07/19 1030 10/09/19 0609 10/10/19 0208  WBC 10.2 9.2 11.3* 9.3  NEUTROABS 6.3  --  8.6*  --   HGB 16.8 16.8 14.6 15.7  HCT 49.8 49.7 41.7 46.9  MCV 92.9 94.3 91.4 93.4  PLT 312 323 325 309   Cardiac Enzymes: No results for input(s): CKTOTAL, CKMB, CKMBINDEX, TROPONINI in the last 168 hours. BNP: Invalid input(s): POCBNP CBG: Recent Labs  Lab 10/09/19 1212 10/09/19 1704 10/09/19 2137 10/10/19 0814 10/10/19 1243  GLUCAP 185* 130* 139* 154* 156*   D-Dimer No results for input(s): DDIMER in the last 72 hours. Hgb A1c No results for input(s):  HGBA1C in the last 72 hours. Lipid Profile Recent Labs    10/09/19 0609 10/09/19 1253  TRIG 205* 136   Thyroid function studies No results for input(s): TSH, T4TOTAL, T3FREE, THYROIDAB in the last 72 hours.  Invalid input(s): FREET3 Anemia work up No results for input(s): VITAMINB12, FOLATE, FERRITIN, TIBC, IRON, RETICCTPCT in the last 72 hours. Urinalysis No results found for: COLORURINE, APPEARANCEUR, [place], [place], [place], [place], [place], [place], PROTEINUR, UROBILINOGEN, NITRITE, LEUKOCYTESUR Sepsis Labs Invalid input(s): PROCALCITONIN,  WBC,  LACTICIDVEN Microbiology Recent Results (from the past 240 hour(s))  Respiratory Panel by RT PCR (Flu A&B, Covid) - Nasopharyngeal Swab     Status: None   Collection Time: 10/01/19  4:30 PM   Specimen: Nasopharyngeal Swab  Result Value Ref Range Status   SARS Coronavirus 2 by RT PCR NEGATIVE NEGATIVE Final    Comment: (NOTE) SARS-CoV-2 target nucleic acids are NOT DETECTED. The SARS-CoV-2 RNA is generally detectable in upper respiratoy specimens during the acute phase of infection. The lowest concentration of SARS-CoV-2 viral copies this assay can detect is 131 copies/mL. A negative result does not preclude SARS-Cov-2 infection and should not be used as the sole basis for treatment or other patient management decisions. A negative result may occur with  improper specimen collection/handling, submission of specimen other than nasopharyngeal swab, presence of viral mutation(s) within the areas targeted by this assay, and inadequate number of viral copies (<131 copies/mL). A negative result must be combined with clinical observations, patient history, and epidemiological information. The expected result is Negative. Fact Sheet for Patients:  PinkCheek.be Fact Sheet for Healthcare Providers:  GravelBags.it This test is not yet ap proved or cleared by the [place]  FDA and  has been authorized for detection and/or diagnosis of SARS-CoV-2 by FDA under an Emergency Use Authorization (EUA). This EUA will remain  in effect (meaning this test can be used) for the duration of the COVID-19 declaration under Section 564(b)(1) of the Act, 21 U.S.C. section 360bbb-3(b)(1),  unless the authorization is terminated or revoked sooner.    Influenza A by PCR NEGATIVE NEGATIVE Final   Influenza B by PCR NEGATIVE NEGATIVE Final    Comment: (NOTE) The Xpert Xpress SARS-CoV-2/FLU/RSV assay is intended as an aid in  the diagnosis of influenza from Nasopharyngeal swab specimens and  should not be used as a sole basis for treatment. Nasal washings and  aspirates are unacceptable for Xpert Xpress SARS-CoV-2/FLU/RSV  testing. Fact Sheet for Patients: PinkCheek.be Fact Sheet for Healthcare Providers: GravelBags.it This test is not yet approved or cleared by the [place] FDA and  has been authorized for detection and/or diagnosis of SARS-CoV-2 by  FDA under an Emergency Use Authorization (EUA). This EUA will remain  in effect (meaning this test can be used) for the duration of the  Covid-19 declaration under Section 564(b)(1) of the Act, 21  U.S.C. section 360bbb-3(b)(1), unless the authorization is  terminated or revoked. Performed at [facility], [place] [address], [place], [place] [address]   Respiratory Panel by RT PCR (Flu A&B, Covid) - Nasopharyngeal Swab     Status: None   Collection Time: 10/07/19 10:57 AM   Specimen: Nasopharyngeal Swab  Result Value Ref Range Status   SARS Coronavirus 2 by RT PCR NEGATIVE NEGATIVE Final    Comment: (NOTE) SARS-CoV-2 target nucleic acids are NOT DETECTED. The SARS-CoV-2 RNA is generally detectable in upper respiratoy specimens during the acute phase of infection. The lowest concentration of SARS-CoV-2 viral copies this assay can detect is 131 copies/mL. A  negative result does not preclude SARS-Cov-2 infection and should not be used as the sole basis for treatment or other patient management decisions. A negative result may occur with  improper specimen collection/handling, submission of specimen other than nasopharyngeal swab, presence of viral mutation(s) within the areas targeted by this assay, and inadequate number of viral copies (<131 copies/mL). A negative result must be combined with clinical observations, patient history, and epidemiological information. The expected result is Negative. Fact Sheet for Patients:  PinkCheek.be Fact Sheet for Healthcare Providers:  GravelBags.it This test is not yet ap proved or cleared by the [place] FDA and  has been authorized for detection and/or diagnosis of SARS-CoV-2 by FDA under an Emergency Use Authorization (EUA). This EUA will remain  in effect (meaning this test can be used) for the duration of the COVID-19 declaration under Section 564(b)(1) of the Act, 21 U.S.C. section 360bbb-3(b)(1), unless the authorization is terminated or revoked sooner.    Influenza A by PCR NEGATIVE NEGATIVE Final   Influenza B by PCR NEGATIVE NEGATIVE Final    Comment: (NOTE) The Xpert Xpress SARS-CoV-2/FLU/RSV assay is intended as an aid in  the diagnosis of influenza from Nasopharyngeal swab specimens and  should not be used as a sole basis for treatment. Nasal washings and  aspirates are unacceptable for Xpert Xpress SARS-CoV-2/FLU/RSV  testing. Fact Sheet for Patients: PinkCheek.be Fact Sheet for Healthcare Providers: GravelBags.it This test is not yet approved or cleared by the [place] FDA and  has been authorized for detection and/or diagnosis of SARS-CoV-2 by  FDA under an Emergency Use Authorization (EUA). This EUA will remain  in effect (meaning this test can be used) for  the duration of the  Covid-19 declaration under Section 564(b)(1) of the Act, 21  U.S.C. section 360bbb-3(b)(1), unless the authorization is  terminated or revoked. Performed at [facility], [place] [address], [place], [place] [address]   MRSA PCR Screening  Status: None   Collection Time: 10/08/19  4:55 PM   Specimen: Nasal Mucosa; Nasopharyngeal  Result Value Ref Range Status   MRSA by PCR NEGATIVE NEGATIVE Final    Comment:        The GeneXpert MRSA Assay (FDA approved for NASAL specimens only), is one component of a comprehensive MRSA colonization surveillance program. It is not intended to diagnose MRSA infection nor to guide or monitor treatment for MRSA infections. Performed at [facility], [place] [name], [place], [place] [address]      Time coordinating discharge: 34 minutes.   SIGNED:   Hosie Poisson, MD  [facility]

## 2019-10-22 ENCOUNTER — Telehealth (HOSPITAL_COMMUNITY): Payer: Self-pay

## 2019-10-22 NOTE — Telephone Encounter (Signed)
Called to schedule stroke f/u, no answer, left vm. AW  

## 2019-10-28 ENCOUNTER — Other Ambulatory Visit: Payer: Self-pay | Admitting: *Deleted

## 2019-10-28 ENCOUNTER — Encounter: Payer: Self-pay | Admitting: *Deleted

## 2019-10-28 NOTE — Patient Outreach (Addendum)
[facility]) Care Management  10/28/2019  David Hendrix [birth year] [id number]   Subjective: Telephone call to patient's home number, spoke with patient, and HIPAA verified.  Discussed [facility] Care Management Medicare EMMI Stroke Red Flag Alert follow up, patient voiced understanding, and is in agreement to follow up.  States he remembers receiving EMMI automated calls.  Patient states he has relocated to [place] approximately 3 weeks ago with his mother David Hendrix) and has left [place], because son was unable to provide assistance.  Patient states he is able to manage self care and has assistance as needed.  States he has experienced a lot of stress with his relationship with son and daughter-in-law, his sister is assisting with this relationship management.   States he has assistance from sister and girlfriend, as needed.     Patient states he is doing well, left side of face still with slight droop, able to perform activities of daily living without difficulty, has established care with new primary MD  (does not have name available at this time), and had first visit on 10/21/2019.  States new primary MD has already received medical records from [facility] MD's, visit went well, primary will be setting up home health, and patient will follow up with MD's office if he has not received a call back regarding home health by 10/30/2019.  States his primary MD will also arrange follow up with other specialist as needed and has a follow up with primary MD in 2 months.  States primary MD states he is doing surprisingly well recovering from stroke and carotid stenosis.  Patient states he is aware of signs/ symptoms to report, how to reach provider if needed after hours, when to go to ED, and / or call 911.   Patient states he has resumed smoking, aware of the risk factors, smokes 1 cigarette in the morning, 1 cigarette  in the evening, is aware of the risk of smoking, and is planning to quit in the near future.    Patient states primary MD has advised him of the importance of taking his medications as prescribed  and follow up with all providers.   States he is aware of the seriousness of his stroke and his other medical conditions.     Patient voices understanding of medical diagnosis and treatment plan.  States he is accessing his Medicare benefits as needed via member services number on back of card. Patient states he does not have any education material, EMMI follow up, care coordination, care management, disease monitoring, transportation, community resource, or pharmacy needs at this time. States he is very appreciative of the follow up, requested  EMMI automated calls be discontinued, due to relocation, and does not feel like needs [facility] Care Management follow up, since his new primary MD is taking care of all of his needs.      Objective: Per KPN (Knowledge Performance Now, point of care tool) and chart review, patient hospitalized 10/07/2019 -10/10/2019 for Carotid stenosis, symptomatic, with infarction.   Patient hospitalized 10/01/2019 -10/04/2019 for CVA.    Patient also has a history of hypertension, Controlled type 2 diabetes mellitus with hyperglycemia, without long-term current use of insulin, Noncompliance to medications, Tobacco dependence, Internal carotid artery stenosis, right, and Hyperlipidemia.    Assessment: Received Medicare EMMI Stroke Red Flag Alert follow up referral on 10/21/2019.   Red Flag Alert Trigger, Day #13, patient answered no to the following question: Went to follow-up appointment?  EMMI follow up completed and no  further care management needs.       Plan: RNCM will send request to discontinue EMMI automated calls,  to [name] at [facility] Management, per patient's request.   RNCM will complete case closure due to follow up completed / no care management needs.        David Hendrix H. Gardiner Barefoot, BSN, CCM [facility] Care Management [facility] Telephonic CM Phone: [phone] Fax:  [phone]

## 2019-10-30 ENCOUNTER — Telehealth (HOSPITAL_COMMUNITY): Payer: Self-pay

## 2019-10-30 NOTE — Telephone Encounter (Signed)
Called to schedule f/u, no answer, left vm. AW 

## 2019-11-06 ENCOUNTER — Telehealth (HOSPITAL_COMMUNITY): Payer: Self-pay

## 2019-11-06 NOTE — Telephone Encounter (Signed)
Called to schedule cta and clinic visit. Pt says that he doesn't live here anymore. Will do f/u's with physician in [place]. AW

## 2022-03-06 IMAGING — XA IR PERCUTANEOUS ART THORMBECTOMY/INFUSION INTRACRANIAL INCLUDE D
1 series · 9 of 24 positions shown · IV contrast (IODINE)
Comparison: Diagnostic catheter arteriogram October 03, 2019, and
CT angiogram of the head and neck October 01, 2019.

CLINICAL DATA: Right cerebral hemispheric ischemic infarct. Right
internal carotid artery occlusion proximally and intracranially.

EXAM:
IR PERCUTANEOUS ART THORMBECTOMY/INFUSION INTRACRANIAL INCLUDE DIAG
ANGIO; IR CT HEAD LIMITED; RADIOLOGY EXAMINATION; IR ENDOVASCULAR
INTRACRANIAL INFUSION OTHER THAN THROMBOLYSIS ARTERIAL INCLUDE DIAG
ANGIO
TECHNIQUE: Informed written consent was obtained from the patient after a
thorough discussion of the procedural risks, benefits and
alternatives. All questions were addressed. Maximal Sterile Barrier
Technique was utilized including caps, mask, sterile gowns, sterile
gloves, sterile drape, hand hygiene and skin antiseptic. A timeout
was performed prior to the initiation of the procedure.

[Series 300: dr. (person_name) · 9 of 294 slices shown]
[im 13/294]
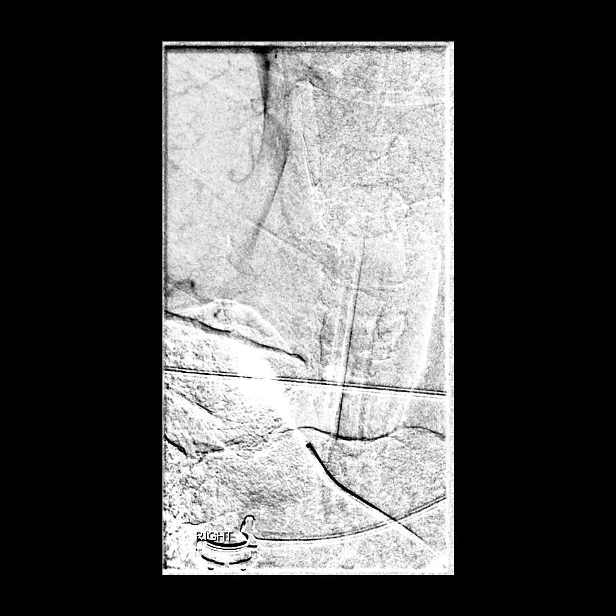
[im 51/294]
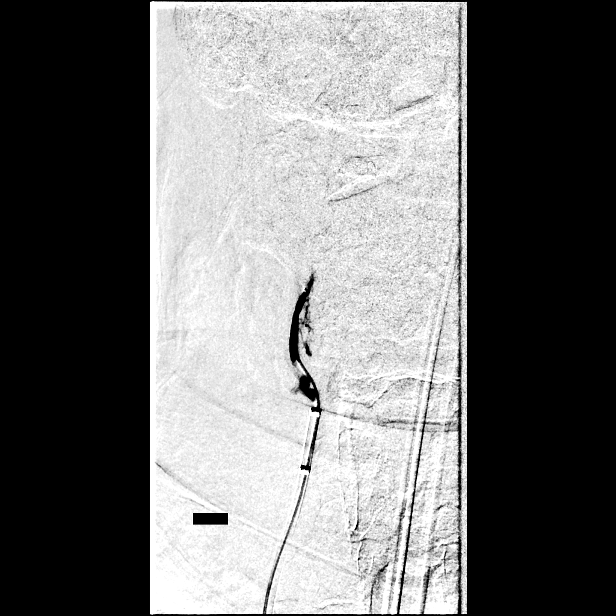
[im 77/294]
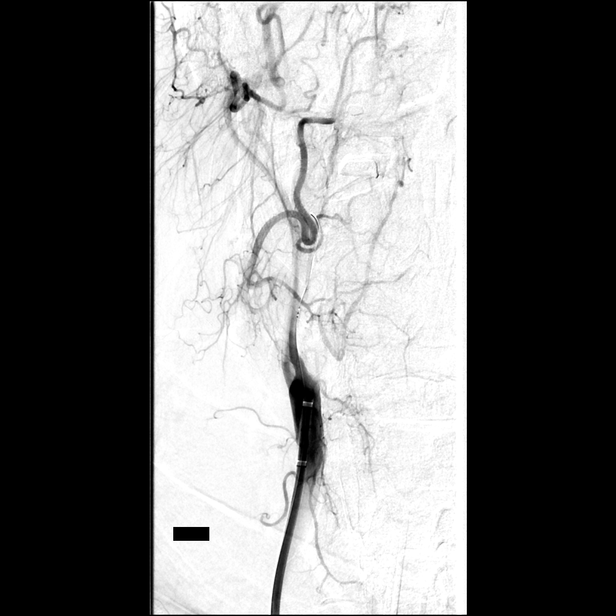
[im 115/294]
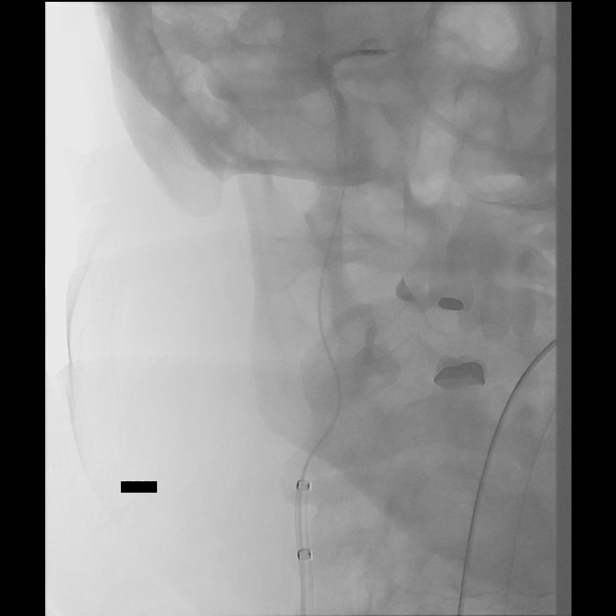
[im 153/294]
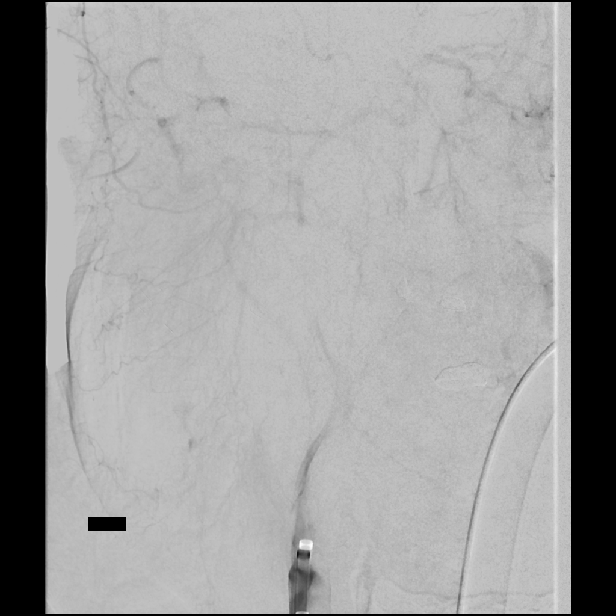
[im 179/294]
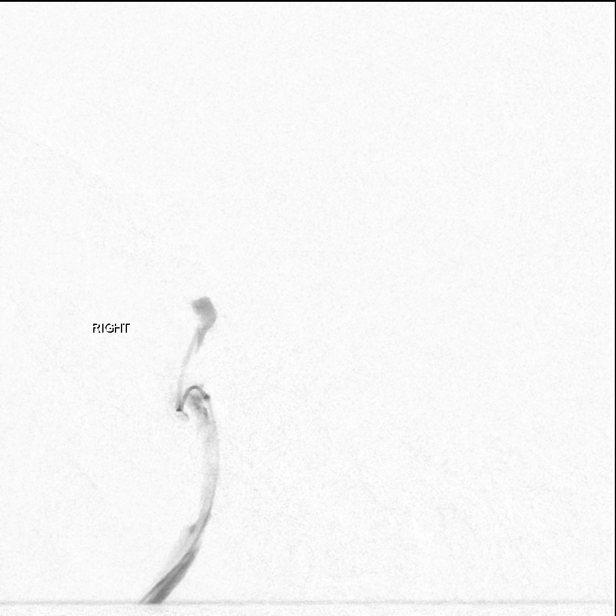
[im 217/294]
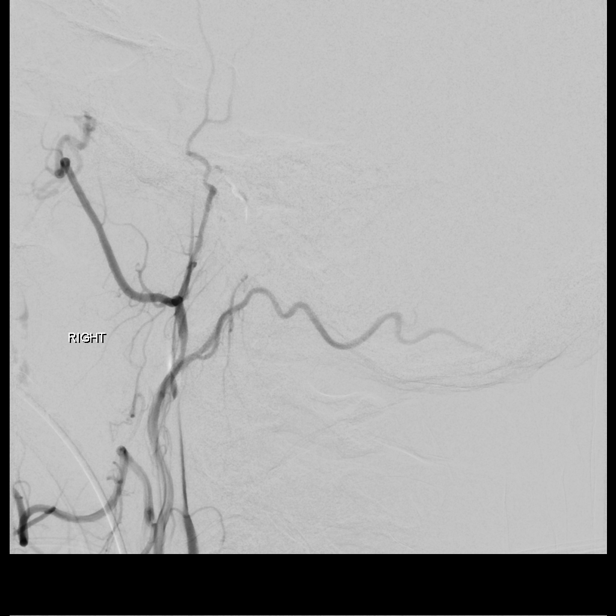
[im 255/294]
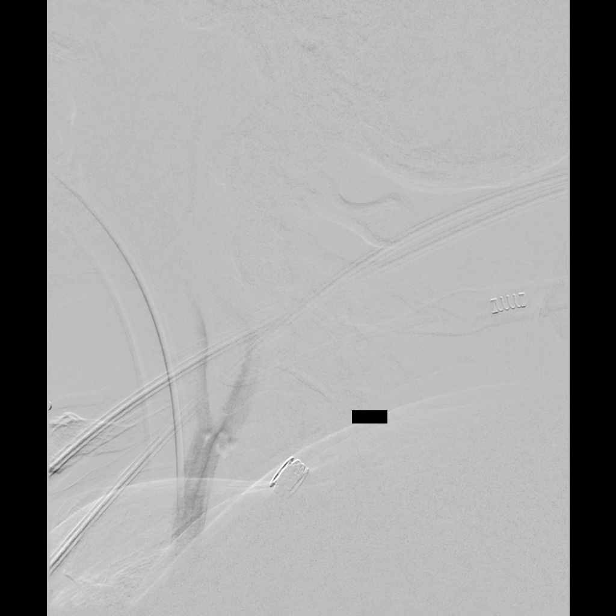
[im 281/294]
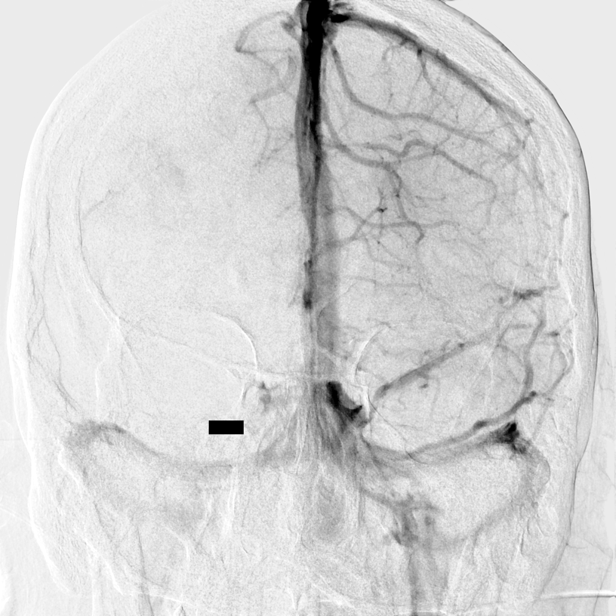

[9 of 24 positions shown; findings below may reference images not displayed]

MEDICATIONS:
Heparin 1555 units IV;

Ancef 2 g IV antibiotic was administered within 1 hour of the
procedure.

ANESTHESIA/SEDATION:
General anesthesia.

CONTRAST:  Isovue 300 approximately 90 mL.

FLUOROSCOPY TIME:  Fluoroscopy Time: 78 minutes 36 seconds (6679
mGy).

COMPLICATIONS:
None immediate.
The right groin was prepped and draped in the usual sterile fashion.
Thereafter using modified Seldinger technique, transfemoral access
into the right common femoral artery was obtained without
difficulty. Over a 0.035 inch guidewire, an 8 French 25 cm Pinnacle
sheath was inserted. Through this, and also over 0.035 inch
guidewire, a 5 French JB 1 catheter was advanced to the aortic arch
region and selectively positioned in the right common carotid
artery, the left common carotid artery and the left vertebral
artery.
FINDINGS: The right common carotid arteriogram demonstrates the right external
carotid artery and its major branches to be widely patent.

The right internal carotid artery at the bulb demonstrates a
complete occlusion, with a delayed string sign opacification of the
proximal [DATE] of the right internal carotid artery.

More distally there is no evidence of reconstitution of the right
ICA from the ipsilateral external carotid artery branches.

ENDOVASCULAR REVASCULARIZATION OF THE OCCLUDED RIGHT INTERNAL
CAROTID ARTERY EXTRA CRANIALLY WITH BALLOON ANGIOPLASTY, AND
MECHANICAL THROMBECTOMY WITH ASPIRATION

The diagnostic JB 1 catheter in right common carotid artery was
exchanged over a 0.035 inch 300 cm Rosen exchange guidewire for a 95
cm 087 balloon guide catheter which was positioned just proximal to
the right internal carotid artery origin. The guidewire was removed.
Good aspiration was obtained from the balloon guide catheter.

Over a 0.014 inch standard Synchro micro guidewire, a Trevo ProVue
021 microcatheter was then advanced just distal to the balloon guide
in the right internal carotid artery bulb region.

With a gentle manipulation, the micro guidewire could be advanced to
the mid cervical right internal carotid artery.

Further advancement of the micro guidewire was met with resistance.
The micro guidewire was removed. A gentle control arteriogram
performed through the microcatheter demonstrated improved
opacification in the proximal right internal carotid artery at the
level of the severe stenosis at the bulb.

The microcatheter was then exchanged for a 014 inch 300 cm Transend
softip exchange micro guidewire using biplane roadmap technique and
constant fluoroscopic guidance. A 4 mm x 30 mm Viatrac 014
angioplasty balloon microcatheter which been prepped with 50%
contrast and 50% heparinized saline infusion was then advanced and
positioned adequate distant at the site of the severe stenosis.

A control inflation was then performed of the balloon using micro
inflation syringe device via micro tubing to normal pressure where
it was maintained for approximately 30 seconds. Balloon was then
deflated and retrieved and removed. A control arteriogram performed
through the balloon guide demonstrated now more string like flow
into the distal right internal carotid artery.

The microcatheter was reintroduced over the exchange micro guidewire
which was removed. An 016 Fathom micro guidewire was then gently
manipulated with the microcatheter to the cervical petrous junction.
The guidewire was removed. Steering aspiration was obtained at the
hub of the microcatheter.

A gentle control arteriogram performed through the microcatheter
demonstrated contrast mixed with copious amounts of the filling
defects consistent with clot in the distal cervical left ICA
extending into the petrous segment.

A 6 mm x 40 mm Solitaire X retrieval device was then advanced to the
distal end of the microcatheter. This was then deployed in the usual
manner with the microcatheter retrieved more proximally.

With proximal flow arrest in the right internal carotid artery, and
constant aspiration with a penumbra aspiration device at the hub of
the balloon guide catheter, the combination of the retrieval device,
and the microcatheter were retrieved and removed. Aspiration was
continued through the hub of the balloon guide. Small specks of dark
of clots could be seen being aspirated into the canister.

The balloon guide was then deflated and retrieved just proximal to
the origin of the right internal carotid artery. An arteriogram
performed at this site now demonstrated improved opacification into
the right internal carotid artery extra cranially and to the level
of the proximal petrous segment.

An 027 Marksman microcatheter was then advanced over a 0.014 inch
standard Synchro micro guidewire to the petrous cavernous junction.

Aspiration was applied through the balloon guide at this site with a
few specks of clot noted in the aspirate. Through the Marksman 027
microcatheter which was then advanced at the petrous cavernous
junction, initially an 014 inch and then two 016 inch micro
guidewires were used to advanced into the more distal cavernous and
supraclinoid segments without success.

A control arteriogram performed gently through the microcatheter
demonstrated extensive firm circumferential filling defect extending
from the proximal cavernous segment. No antegrade contrast was seen
into the more distal cavernous segment.

A control arteriogram performed through the microcatheter following
multiple attempts to cross the firm severe occlusion of the caval
cavernous segment, demonstrated contrast around the clot, but also
contrast was noted into the adjacent tubular structures likely
representing venous channels.

Given increased risk of inducing an iatrogenic perforation and/or a
carotid cavernous fistula, the procedure was stopped.

The balloon guide catheter was retrieved into the right common
carotid artery and arteriogram performed at this site continued to
demonstrate flow in the right external carotid artery branch, and
also modestly improved flow in the right internal carotid artery
proximally to the cranial skull base.

The balloon guide was then retrieved and removed. A 5 French
diagnostic catheter was reintroduced over a 0.035 inch Roadrunner
guidewire, and select cannulation was performed through the left
vertebral artery, and the left common carotid artery.

The left vertebral artery arteriogram continued to demonstrate
patency of the left vertebral artery proximally and also extending
to the cranial skull base. Wide patency was maintained of the left
vertebrobasilar junction, the basilar artery, the posterior cerebral
arteries, the superior cerebellar arteries and the anterior-inferior
cerebellar arteries into the capillary and venous phases. Prompt
simultaneous retrograde opacification via the right posterior
communicating artery was seen of the supraclinoid right ICA and the
right middle and the right anterior cerebral artery into the
capillary and venous phases. No change was seen in the occluded
inferior division of the right middle cerebral artery seen on a
previous arteriogram corresponding to the patient's ischemic
infarction in the right frontal cortical and subcortical regions.

The left common carotid arteriogram continued to demonstrate wide
patency of the left external carotid artery and the left internal
carotid artery to the cranial skull base. The petrous, the cavernous
and the supraclinoid segments remain patent.

The left middle cerebral artery and the left anterior cerebral
artery opacify into the capillary and venous phases. Minimal
cross-filling via the anterior and posterior branches arising from
the left pericallosal artery was noted.

The diagnostic catheter was removed. The 8 French sheath in the
right groin was then exchanged over a 0.035 inch Roadrunner
guidewire for an 8 French Angio-Seal closure device. The right groin
appeared soft. Distal pulses and the posterior tibial regions, and
the dorsal petrous regions remained Dopplerable.

CT scan of the brain demonstrated no evidence of intracranial
hemorrhage, or mass effect or midline shift.

Initially prior to the procedure, a CT performed on the table again
demonstrates no evidence of hemorrhage at the site of the previous
ischemic stroke in the right frontal region.

The patient was then extubated without difficulty. Upon recovery,
the patient was able to Trussell simple commands and was moving all
fours with minimal left pronation drift. He denied, complaints of
headaches, nausea, vomiting or visual changes.

He was then transferred to the neuro ICU for post revascularization
care.
IMPRESSION: Status post endovascular revascularization of occluded right
internal carotid artery proximally extra cranially with balloon
angioplasty, 1 pass with a Solitaire X 6 mm x 40 mm retrieval device
and Penumbra aspiration.

Continued occlusion of the right internal carotid artery distal
cavernous segment.

Reconstitution of the right middle cerebral artery and the right
anterior cerebral artery via the anterior communicating artery, and
the posterior communicating artery as described above.

PLAN:
Follow-up in the clinic 4 weeks post discharge.
